# Patient Record
Sex: Female | Born: 1995 | Race: White | Hispanic: No | Marital: Married | Smoking: Former smoker
Health system: Southern US, Community
[De-identification: ages and names within clinical notes are randomized; demographics above are authoritative.]

## PROBLEM LIST (undated history)

## (undated) ENCOUNTER — Emergency Department (HOSPITAL_COMMUNITY): Admission: EM | Payer: Self-pay | Source: Home / Self Care

## (undated) ENCOUNTER — Inpatient Hospital Stay (HOSPITAL_COMMUNITY): Payer: Self-pay

## (undated) DIAGNOSIS — F329 Major depressive disorder, single episode, unspecified: Secondary | ICD-10-CM

## (undated) DIAGNOSIS — F419 Anxiety disorder, unspecified: Secondary | ICD-10-CM

## (undated) DIAGNOSIS — F32A Depression, unspecified: Secondary | ICD-10-CM

## (undated) DIAGNOSIS — E079 Disorder of thyroid, unspecified: Secondary | ICD-10-CM

## (undated) DIAGNOSIS — R569 Unspecified convulsions: Secondary | ICD-10-CM

## (undated) DIAGNOSIS — G40909 Epilepsy, unspecified, not intractable, without status epilepticus: Secondary | ICD-10-CM

## (undated) DIAGNOSIS — D649 Anemia, unspecified: Secondary | ICD-10-CM

## (undated) HISTORY — DX: Major depressive disorder, single episode, unspecified: F32.9

## (undated) HISTORY — PX: WISDOM TOOTH EXTRACTION: SHX21

## (undated) HISTORY — DX: Depression, unspecified: F32.A

---
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
## 2017-01-10 ENCOUNTER — Other Ambulatory Visit (HOSPITAL_COMMUNITY)
Admission: RE | Admit: 2017-01-10 | Discharge: 2017-01-10 | Disposition: A | Discharge status: Home / Self Care | Payer: Medicaid Other | Source: Ambulatory Visit | Attending: Obstetrics & Gynecology | Admitting: Obstetrics & Gynecology

## 2017-01-10 ENCOUNTER — Ambulatory Visit (INDEPENDENT_AMBULATORY_CARE_PROVIDER_SITE_OTHER): Payer: Medicaid Other | Admitting: Obstetrics & Gynecology

## 2017-01-10 VITALS — BP 121/74 | HR 130 | Temp 97.9°F | Wt 178.0 lb

## 2017-01-10 DIAGNOSIS — Z3A35 35 weeks gestation of pregnancy: Secondary | ICD-10-CM | POA: Diagnosis not present

## 2017-01-10 DIAGNOSIS — Z3483 Encounter for supervision of other normal pregnancy, third trimester: Secondary | ICD-10-CM | POA: Insufficient documentation

## 2017-01-10 DIAGNOSIS — Z3400 Encounter for supervision of normal first pregnancy, unspecified trimester: Secondary | ICD-10-CM

## 2017-01-10 DIAGNOSIS — Z3403 Encounter for supervision of normal first pregnancy, third trimester: Secondary | ICD-10-CM

## 2017-01-10 DIAGNOSIS — Z113 Encounter for screening for infections with a predominantly sexual mode of transmission: Secondary | ICD-10-CM

## 2017-01-10 NOTE — Progress Notes (Signed)
Pt c/o rectal pressure intermittently.
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
## 2017-01-12 LAB — URINE CYTOLOGY ANCILLARY ONLY
Chlamydia: NEGATIVE
NEISSERIA GONORRHEA: NEGATIVE

## 2017-01-12 NOTE — Addendum Note (Signed)
Addended by: Pennie Banter on: 01/12/2017 09:23 AM   Modules accepted: Orders

## 2017-01-24 ENCOUNTER — Ambulatory Visit (INDEPENDENT_AMBULATORY_CARE_PROVIDER_SITE_OTHER): Payer: Medicaid Other | Admitting: Obstetrics and Gynecology

## 2017-01-24 VITALS — BP 123/78 | HR 114 | Wt 179.0 lb

## 2017-01-24 DIAGNOSIS — Z3403 Encounter for supervision of normal first pregnancy, third trimester: Secondary | ICD-10-CM

## 2017-01-24 DIAGNOSIS — Z3400 Encounter for supervision of normal first pregnancy, unspecified trimester: Secondary | ICD-10-CM

## 2017-01-24 DIAGNOSIS — O09893 Supervision of other high risk pregnancies, third trimester: Secondary | ICD-10-CM

## 2017-01-24 DIAGNOSIS — Z6791 Unspecified blood type, Rh negative: Secondary | ICD-10-CM

## 2017-01-24 DIAGNOSIS — O26893 Other specified pregnancy related conditions, third trimester: Principal | ICD-10-CM

## 2017-01-24 LAB — OB RESULTS CONSOLE GBS: STREP GROUP B AG: NEGATIVE
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
## 2017-01-28 LAB — CULTURE, BETA STREP (GROUP B ONLY): Strep Gp B Culture: NEGATIVE

## 2017-01-31 ENCOUNTER — Other Ambulatory Visit: Payer: Self-pay | Admitting: Obstetrics & Gynecology

## 2017-01-31 ENCOUNTER — Ambulatory Visit (HOSPITAL_COMMUNITY)
Admission: RE | Admit: 2017-01-31 | Discharge: 2017-01-31 | Disposition: A | Discharge status: Home / Self Care | Payer: Medicaid Other | Source: Ambulatory Visit | Attending: Obstetrics & Gynecology | Admitting: Obstetrics & Gynecology

## 2017-01-31 ENCOUNTER — Ambulatory Visit (INDEPENDENT_AMBULATORY_CARE_PROVIDER_SITE_OTHER): Payer: Medicaid Other | Admitting: Obstetrics and Gynecology

## 2017-01-31 VITALS — BP 127/79 | HR 101 | Wt 184.0 lb

## 2017-01-31 DIAGNOSIS — Z3400 Encounter for supervision of normal first pregnancy, unspecified trimester: Secondary | ICD-10-CM

## 2017-01-31 DIAGNOSIS — Z3A38 38 weeks gestation of pregnancy: Secondary | ICD-10-CM

## 2017-01-31 DIAGNOSIS — O99323 Drug use complicating pregnancy, third trimester: Secondary | ICD-10-CM

## 2017-01-31 DIAGNOSIS — O360190 Maternal care for anti-D [Rh] antibodies, unspecified trimester, not applicable or unspecified: Secondary | ICD-10-CM | POA: Diagnosis not present

## 2017-01-31 DIAGNOSIS — O26843 Uterine size-date discrepancy, third trimester: Secondary | ICD-10-CM | POA: Insufficient documentation

## 2017-01-31 DIAGNOSIS — Z3403 Encounter for supervision of normal first pregnancy, third trimester: Secondary | ICD-10-CM

## 2017-01-31 DIAGNOSIS — F1210 Cannabis abuse, uncomplicated: Secondary | ICD-10-CM | POA: Insufficient documentation
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
## 2017-02-05 ENCOUNTER — Inpatient Hospital Stay (HOSPITAL_COMMUNITY): Payer: Medicaid Other | Admitting: Anesthesiology

## 2017-02-05 ENCOUNTER — Encounter (HOSPITAL_COMMUNITY)
Admission: AD | Disposition: A | Discharge status: Home / Self Care | Payer: Self-pay | Source: Ambulatory Visit | Attending: Obstetrics & Gynecology

## 2017-02-05 ENCOUNTER — Encounter (HOSPITAL_COMMUNITY): Payer: Self-pay

## 2017-02-05 ENCOUNTER — Inpatient Hospital Stay (HOSPITAL_COMMUNITY)
Admission: AD | Admit: 2017-02-05 | Discharge: 2017-02-08 | DRG: 765 | Disposition: A | Discharge status: Home / Self Care | Payer: Medicaid Other | Source: Ambulatory Visit | Attending: Obstetrics & Gynecology | Admitting: Obstetrics & Gynecology

## 2017-02-05 DIAGNOSIS — Z3A39 39 weeks gestation of pregnancy: Secondary | ICD-10-CM | POA: Diagnosis not present

## 2017-02-05 DIAGNOSIS — Z2839 Other underimmunization status: Secondary | ICD-10-CM

## 2017-02-05 DIAGNOSIS — O99344 Other mental disorders complicating childbirth: Secondary | ICD-10-CM | POA: Diagnosis present

## 2017-02-05 DIAGNOSIS — Z283 Underimmunization status: Secondary | ICD-10-CM

## 2017-02-05 DIAGNOSIS — F332 Major depressive disorder, recurrent severe without psychotic features: Secondary | ICD-10-CM | POA: Diagnosis present

## 2017-02-05 DIAGNOSIS — O9902 Anemia complicating childbirth: Secondary | ICD-10-CM | POA: Diagnosis present

## 2017-02-05 DIAGNOSIS — O9989 Other specified diseases and conditions complicating pregnancy, childbirth and the puerperium: Secondary | ICD-10-CM

## 2017-02-05 DIAGNOSIS — O26843 Uterine size-date discrepancy, third trimester: Secondary | ICD-10-CM | POA: Diagnosis present

## 2017-02-05 DIAGNOSIS — D649 Anemia, unspecified: Secondary | ICD-10-CM | POA: Diagnosis present

## 2017-02-05 DIAGNOSIS — O4292 Full-term premature rupture of membranes, unspecified as to length of time between rupture and onset of labor: Principal | ICD-10-CM | POA: Diagnosis present

## 2017-02-05 DIAGNOSIS — O26899 Other specified pregnancy related conditions, unspecified trimester: Secondary | ICD-10-CM

## 2017-02-05 DIAGNOSIS — O26893 Other specified pregnancy related conditions, third trimester: Secondary | ICD-10-CM | POA: Diagnosis present

## 2017-02-05 DIAGNOSIS — Z30017 Encounter for initial prescription of implantable subdermal contraceptive: Secondary | ICD-10-CM | POA: Diagnosis not present

## 2017-02-05 DIAGNOSIS — Z3400 Encounter for supervision of normal first pregnancy, unspecified trimester: Secondary | ICD-10-CM

## 2017-02-05 DIAGNOSIS — O34219 Maternal care for unspecified type scar from previous cesarean delivery: Secondary | ICD-10-CM

## 2017-02-05 DIAGNOSIS — O99324 Drug use complicating childbirth: Secondary | ICD-10-CM | POA: Diagnosis present

## 2017-02-05 DIAGNOSIS — F1210 Cannabis abuse, uncomplicated: Secondary | ICD-10-CM | POA: Diagnosis present

## 2017-02-05 DIAGNOSIS — Z98891 History of uterine scar from previous surgery: Secondary | ICD-10-CM

## 2017-02-05 DIAGNOSIS — Z3049 Encounter for surveillance of other contraceptives: Secondary | ICD-10-CM | POA: Diagnosis not present

## 2017-02-05 DIAGNOSIS — Z6791 Unspecified blood type, Rh negative: Secondary | ICD-10-CM

## 2017-02-05 DIAGNOSIS — IMO0002 Reserved for concepts with insufficient information to code with codable children: Secondary | ICD-10-CM

## 2017-02-05 DIAGNOSIS — O4290 Premature rupture of membranes, unspecified as to length of time between rupture and onset of labor, unspecified weeks of gestation: Secondary | ICD-10-CM | POA: Diagnosis present

## 2017-02-05 LAB — CBC
HCT: 28 % — ABNORMAL LOW (ref 36.0–46.0)
Hemoglobin: 8.8 g/dL — ABNORMAL LOW (ref 12.0–15.0)
MCH: 23.8 pg — AB (ref 26.0–34.0)
MCHC: 31.4 g/dL (ref 30.0–36.0)
MCV: 75.7 fL — AB (ref 78.0–100.0)
PLATELETS: 243 10*3/uL (ref 150–400)
RBC: 3.7 MIL/uL — ABNORMAL LOW (ref 3.87–5.11)
RDW: 15.3 % (ref 11.5–15.5)
WBC: 14.8 10*3/uL — ABNORMAL HIGH (ref 4.0–10.5)

## 2017-02-05 LAB — TYPE AND SCREEN
ABO/RH(D): O NEG
Antibody Screen: NEGATIVE

## 2017-02-05 LAB — RPR: RPR: NONREACTIVE

## 2017-02-05 LAB — POCT FERN TEST: POCT Fern Test: POSITIVE

## 2017-02-05 SURGERY — Surgical Case
Anesthesia: Spinal

## 2017-02-05 MED ORDER — EPHEDRINE 5 MG/ML INJ: 10.0000 mg | INTRAVENOUS | Status: DC | PRN

## 2017-02-05 MED ORDER — MORPHINE SULFATE (PF) 0.5 MG/ML IJ SOLN
INTRAMUSCULAR | Status: DC | PRN
  Administered 2017-02-05: .2 mg via INTRATHECAL

## 2017-02-05 MED ORDER — OXYTOCIN 40 UNITS IN LACTATED RINGERS INFUSION - SIMPLE MED
1.0000 m[IU]/min | INTRAVENOUS | Status: DC
  Administered 2017-02-05: 2 m[IU]/min via INTRAVENOUS
  Filled 2017-02-05: qty 1000

## 2017-02-05 MED ORDER — ONDANSETRON HCL 4 MG/2ML IJ SOLN
INTRAMUSCULAR | Status: AC
  Filled 2017-02-05: qty 2

## 2017-02-05 MED ORDER — PHENYLEPHRINE 40 MCG/ML (10ML) SYRINGE FOR IV PUSH (FOR BLOOD PRESSURE SUPPORT)
PREFILLED_SYRINGE | INTRAVENOUS | Status: AC
  Filled 2017-02-05: qty 20

## 2017-02-05 MED ORDER — ONDANSETRON HCL 4 MG/2ML IJ SOLN: 4.0000 mg | Freq: Four times a day (QID) | INTRAMUSCULAR | Status: DC | PRN

## 2017-02-05 MED ORDER — LACTATED RINGERS IV SOLN: 500.0000 mL | Freq: Once | INTRAVENOUS | Status: DC

## 2017-02-05 MED ORDER — ACETAMINOPHEN 325 MG PO TABS: 650.0000 mg | ORAL_TABLET | ORAL | Status: DC | PRN

## 2017-02-05 MED ORDER — FENTANYL 2.5 MCG/ML BUPIVACAINE 1/10 % EPIDURAL INFUSION (WH - ANES): 14.0000 mL/h | INTRAMUSCULAR | Status: DC | PRN

## 2017-02-05 MED ORDER — DEXTROSE 5 % IV SOLN
500.0000 mg | INTRAVENOUS | Status: AC
  Administered 2017-02-05: 500 mg via INTRAVENOUS
  Filled 2017-02-05: qty 500

## 2017-02-05 MED ORDER — BUPIVACAINE IN DEXTROSE 0.75-8.25 % IT SOLN
INTRATHECAL | Status: AC
  Filled 2017-02-05: qty 2

## 2017-02-05 MED ORDER — DEXAMETHASONE SODIUM PHOSPHATE 4 MG/ML IJ SOLN
INTRAMUSCULAR | Status: DC | PRN
  Administered 2017-02-05: 4 mg via INTRAVENOUS

## 2017-02-05 MED ORDER — BUPIVACAINE HCL (PF) 0.5 % IJ SOLN
INTRAMUSCULAR | Status: DC | PRN
  Administered 2017-02-05: 30 mL

## 2017-02-05 MED ORDER — TERBUTALINE SULFATE 1 MG/ML IJ SOLN: 0.2500 mg | Freq: Once | INTRAMUSCULAR | Status: DC | PRN

## 2017-02-05 MED ORDER — KETOROLAC TROMETHAMINE 30 MG/ML IJ SOLN
INTRAMUSCULAR | Status: AC
  Filled 2017-02-05: qty 1

## 2017-02-05 MED ORDER — CEFAZOLIN SODIUM-DEXTROSE 2-4 GM/100ML-% IV SOLN
INTRAVENOUS | Status: AC
  Filled 2017-02-05: qty 100

## 2017-02-05 MED ORDER — DIPHENHYDRAMINE HCL 50 MG/ML IJ SOLN: 12.5000 mg | INTRAMUSCULAR | Status: DC | PRN

## 2017-02-05 MED ORDER — FENTANYL 2.5 MCG/ML BUPIVACAINE 1/10 % EPIDURAL INFUSION (WH - ANES)
INTRAMUSCULAR | Status: AC
  Filled 2017-02-05: qty 100

## 2017-02-05 MED ORDER — LACTATED RINGERS IV SOLN: 500.0000 mL | INTRAVENOUS | Status: DC | PRN

## 2017-02-05 MED ORDER — FENTANYL CITRATE (PF) 100 MCG/2ML IJ SOLN
INTRAMUSCULAR | Status: DC | PRN
  Administered 2017-02-05: 10 ug via INTRATHECAL

## 2017-02-05 MED ORDER — STERILE WATER FOR IRRIGATION IR SOLN
Status: DC | PRN
  Administered 2017-02-05: 1000 mL

## 2017-02-05 MED ORDER — DEXAMETHASONE SODIUM PHOSPHATE 4 MG/ML IJ SOLN
INTRAMUSCULAR | Status: AC
  Filled 2017-02-05: qty 1

## 2017-02-05 MED ORDER — LACTATED RINGERS IV SOLN
INTRAVENOUS | Status: DC | PRN
  Administered 2017-02-05 (×3): via INTRAVENOUS

## 2017-02-05 MED ORDER — SOD CITRATE-CITRIC ACID 500-334 MG/5ML PO SOLN
30.0000 mL | ORAL | Status: DC | PRN
  Administered 2017-02-05: 30 mL via ORAL
  Filled 2017-02-05: qty 15

## 2017-02-05 MED ORDER — MORPHINE SULFATE (PF) 0.5 MG/ML IJ SOLN
INTRAMUSCULAR | Status: AC
  Filled 2017-02-05: qty 10

## 2017-02-05 MED ORDER — PHENYLEPHRINE 8 MG IN D5W 100 ML (0.08MG/ML) PREMIX OPTIME
INJECTION | INTRAVENOUS | Status: AC
  Filled 2017-02-05: qty 100

## 2017-02-05 MED ORDER — OXYTOCIN 10 UNIT/ML IJ SOLN
INTRAVENOUS | Status: DC | PRN
  Administered 2017-02-05 (×2): 40 [IU] via INTRAVENOUS

## 2017-02-05 MED ORDER — OXYTOCIN BOLUS FROM INFUSION: 500.0000 mL | Freq: Once | INTRAVENOUS | Status: DC

## 2017-02-05 MED ORDER — OXYCODONE-ACETAMINOPHEN 5-325 MG PO TABS: 1.0000 | ORAL_TABLET | ORAL | Status: DC | PRN

## 2017-02-05 MED ORDER — BUPIVACAINE HCL (PF) 0.25 % IJ SOLN: INTRAMUSCULAR | Status: DC | PRN

## 2017-02-05 MED ORDER — SCOPOLAMINE 1 MG/3DAYS TD PT72
MEDICATED_PATCH | TRANSDERMAL | Status: DC | PRN
  Administered 2017-02-05: 1 via TRANSDERMAL

## 2017-02-05 MED ORDER — PHENYLEPHRINE 40 MCG/ML (10ML) SYRINGE FOR IV PUSH (FOR BLOOD PRESSURE SUPPORT): 80.0000 ug | PREFILLED_SYRINGE | INTRAVENOUS | Status: DC | PRN

## 2017-02-05 MED ORDER — MEPERIDINE HCL 25 MG/ML IJ SOLN
INTRAMUSCULAR | Status: DC | PRN
  Administered 2017-02-05 (×2): 12.5 mg via INTRAVENOUS

## 2017-02-05 MED ORDER — CEFAZOLIN SODIUM-DEXTROSE 2-3 GM-% IV SOLR
INTRAVENOUS | Status: DC | PRN
  Administered 2017-02-05: 2 g via INTRAVENOUS

## 2017-02-05 MED ORDER — OXYTOCIN 40 UNITS IN LACTATED RINGERS INFUSION - SIMPLE MED: 2.5000 [IU]/h | INTRAVENOUS | Status: DC

## 2017-02-05 MED ORDER — DIPHENHYDRAMINE HCL 50 MG/ML IJ SOLN
INTRAMUSCULAR | Status: DC | PRN
  Administered 2017-02-05: 25 mg via INTRAVENOUS

## 2017-02-05 MED ORDER — LACTATED RINGERS IV SOLN
INTRAVENOUS | Status: DC
  Administered 2017-02-05 (×2): via INTRAVENOUS

## 2017-02-05 MED ORDER — PHENYLEPHRINE HCL 10 MG/ML IJ SOLN
INTRAMUSCULAR | Status: DC | PRN
  Administered 2017-02-05 (×3): 80 ug via INTRAVENOUS

## 2017-02-05 MED ORDER — SCOPOLAMINE 1 MG/3DAYS TD PT72
MEDICATED_PATCH | TRANSDERMAL | Status: AC
  Filled 2017-02-05: qty 1

## 2017-02-05 MED ORDER — BUPIVACAINE IN DEXTROSE 0.75-8.25 % IT SOLN
INTRATHECAL | Status: DC | PRN
  Administered 2017-02-05: 1.8 mL via INTRATHECAL

## 2017-02-05 MED ORDER — KETOROLAC TROMETHAMINE 30 MG/ML IJ SOLN: 30.0000 mg | Freq: Four times a day (QID) | INTRAMUSCULAR | Status: AC | PRN

## 2017-02-05 MED ORDER — PHENYLEPHRINE 8 MG IN D5W 100 ML (0.08MG/ML) PREMIX OPTIME
INJECTION | INTRAVENOUS | Status: DC | PRN
  Administered 2017-02-05: 60 ug/min via INTRAVENOUS

## 2017-02-05 MED ORDER — LIDOCAINE HCL (PF) 1 % IJ SOLN: 30.0000 mL | INTRAMUSCULAR | Status: DC | PRN

## 2017-02-05 MED ORDER — ONDANSETRON HCL 4 MG/2ML IJ SOLN
INTRAMUSCULAR | Status: DC | PRN
  Administered 2017-02-05: 4 mg via INTRAVENOUS

## 2017-02-05 MED ORDER — BUPIVACAINE HCL (PF) 0.5 % IJ SOLN
INTRAMUSCULAR | Status: AC
  Filled 2017-02-05: qty 30

## 2017-02-05 MED ORDER — KETOROLAC TROMETHAMINE 30 MG/ML IJ SOLN
30.0000 mg | Freq: Four times a day (QID) | INTRAMUSCULAR | Status: AC | PRN
  Administered 2017-02-05: 30 mg via INTRAMUSCULAR

## 2017-02-05 MED ORDER — MEPERIDINE HCL 25 MG/ML IJ SOLN
INTRAMUSCULAR | Status: AC
  Filled 2017-02-05: qty 1

## 2017-02-05 MED ORDER — CEFOTETAN DISODIUM-DEXTROSE 2-2.08 GM-% IV SOLR
2.0000 g | INTRAVENOUS | Status: DC
  Filled 2017-02-05: qty 50

## 2017-02-05 MED ORDER — FENTANYL CITRATE (PF) 100 MCG/2ML IJ SOLN
INTRAMUSCULAR | Status: AC
  Filled 2017-02-05: qty 2

## 2017-02-05 MED ORDER — DIPHENHYDRAMINE HCL 50 MG/ML IJ SOLN
INTRAMUSCULAR | Status: AC
  Filled 2017-02-05: qty 1

## 2017-02-05 MED ORDER — OXYTOCIN 10 UNIT/ML IJ SOLN
INTRAMUSCULAR | Status: AC
  Filled 2017-02-05: qty 4

## 2017-02-05 MED ORDER — SODIUM CHLORIDE 0.9 % IR SOLN
Status: DC | PRN
  Administered 2017-02-05: 100 mL

## 2017-02-05 MED ORDER — OXYCODONE-ACETAMINOPHEN 5-325 MG PO TABS: 2.0000 | ORAL_TABLET | ORAL | Status: DC | PRN

## 2017-02-05 SURGICAL SUPPLY — 40 items
BENZOIN TINCTURE PRP APPL 2/3 (GAUZE/BANDAGES/DRESSINGS) ×3 IMPLANT
BLADE TIP J-PLASMA PRECISE LAP (MISCELLANEOUS) ×3 IMPLANT
CHLORAPREP W/TINT 26ML (MISCELLANEOUS) ×3 IMPLANT
CLAMP CORD UMBIL (MISCELLANEOUS) IMPLANT
CLOSURE STERI-STRIP 1/2X4 (GAUZE/BANDAGES/DRESSINGS) ×1
CLOSURE WOUND 1/2 X4 (GAUZE/BANDAGES/DRESSINGS) ×1
CLOTH BEACON ORANGE TIMEOUT ST (SAFETY) ×3 IMPLANT
CLSR STERI-STRIP ANTIMIC 1/2X4 (GAUZE/BANDAGES/DRESSINGS) ×2 IMPLANT
DRSG OPSITE POSTOP 4X10 (GAUZE/BANDAGES/DRESSINGS) ×9 IMPLANT
ELECT REM PT RETURN 9FT ADLT (ELECTROSURGICAL) ×3
ELECTRODE REM PT RTRN 9FT ADLT (ELECTROSURGICAL) ×1 IMPLANT
EXTRACTOR VACUUM KIWI (MISCELLANEOUS) IMPLANT
GAUZE PACKING 1/2 X5 YD (GAUZE/BANDAGES/DRESSINGS) ×3 IMPLANT
GLOVE BIO SURGEON STRL SZ7 (GLOVE) ×3 IMPLANT
GLOVE BIOGEL PI IND STRL 7.0 (GLOVE) ×2 IMPLANT
GLOVE BIOGEL PI INDICATOR 7.0 (GLOVE) ×4
GOWN STRL REUS W/TWL LRG LVL3 (GOWN DISPOSABLE) ×6 IMPLANT
GOWN STRL REUS W/TWL XL LVL3 (GOWN DISPOSABLE) ×3 IMPLANT
KIT ABG SYR 3ML LUER SLIP (SYRINGE) IMPLANT
NEEDLE HYPO 22GX1.5 SAFETY (NEEDLE) ×3 IMPLANT
NEEDLE HYPO 25X5/8 SAFETYGLIDE (NEEDLE) IMPLANT
NS IRRIG 1000ML POUR BTL (IV SOLUTION) ×3 IMPLANT
PACK C SECTION WH (CUSTOM PROCEDURE TRAY) ×3 IMPLANT
PAD ABD DERMACEA PRESS 5X9 (GAUZE/BANDAGES/DRESSINGS) ×3 IMPLANT
PAD OB MATERNITY 4.3X12.25 (PERSONAL CARE ITEMS) ×3 IMPLANT
PENCIL SMOKE EVAC W/HOLSTER (ELECTROSURGICAL) ×3 IMPLANT
RTRCTR C-SECT PINK 25CM LRG (MISCELLANEOUS) IMPLANT
SPONGE SURGIFOAM ABS GEL 12-7 (HEMOSTASIS) IMPLANT
STRIP CLOSURE SKIN 1/2X4 (GAUZE/BANDAGES/DRESSINGS) ×2 IMPLANT
SUT PDS AB 0 CTX 60 (SUTURE) IMPLANT
SUT PLAIN 0 NONE (SUTURE) IMPLANT
SUT SILK 0 TIES 10X30 (SUTURE) IMPLANT
SUT VIC AB 0 CT1 36 (SUTURE) ×9 IMPLANT
SUT VIC AB 3-0 CT1 27 (SUTURE) ×2
SUT VIC AB 3-0 CT1 TAPERPNT 27 (SUTURE) ×1 IMPLANT
SUT VIC AB 4-0 KS 27 (SUTURE) ×3 IMPLANT
SYR CONTROL 10ML LL (SYRINGE) ×3 IMPLANT
TAPE CLOTH SURG 6X10 WHT LF (GAUZE/BANDAGES/DRESSINGS) ×3 IMPLANT
TOWEL OR 17X24 6PK STRL BLUE (TOWEL DISPOSABLE) ×3 IMPLANT
TRAY FOLEY BAG SILVER LF 14FR (SET/KITS/TRAYS/PACK) ×3 IMPLANT

## 2017-02-05 NOTE — Op Note (Signed)
Krista Combs PROCEDURE DATE: 02/05/2017  PREOPERATIVE DIAGNOSES: Intrauterine pregnancy at [redacted]w[redacted]d weeks gestation; non-reassuring fetal status  POSTOPERATIVE DIAGNOSES: The same  PROCEDURE: Primary Low Transverse Cesarean Section  SURGEON:  Dr. Eber Combs L. Harraway-Smith   ASSISTANT:  Dr. Caryl Combs  ANESTHESIOLOGY TEAM: Anesthesiologist: Krista Rich, MD CRNA: Krista Heritage, CRNA  INDICATIONS: Krista Combs is a 21 y.o. G3P0020 at [redacted]w[redacted]d here for cesarean section secondary to the indications listed under preoperative diagnoses; please see preoperative note for further details.  The risks of cesarean section were discussed with the patient including but were not limited to: bleeding which may require transfusion or reoperation; infection which may require antibiotics; injury to bowel, bladder, ureters or other surrounding organs; injury to the fetus; need for additional procedures including hysterectomy in the event of a life-threatening hemorrhage; placental abnormalities wth subsequent pregnancies, incisional problems, thromboembolic phenomenon and other postoperative/anesthesia complications.   The patient concurred with the proposed plan, giving informed written consent for the procedure.    FINDINGS:  Viable female infant in cephalic transverse presentation. Clear amniotic fluid.  Intact placenta, three vessel cord.  Normal uterus, fallopian tubes and ovaries bilaterally.  ANESTHESIA: Spinal INTRAVENOUS FLUIDS: 2000 ml   ESTIMATED BLOOD LOSS: 685 ml URINE OUTPUT:  650 ml SPECIMENS: Placenta sent to L&D COMPLICATIONS: None immediate  PROCEDURE IN DETAIL:  The patient preoperatively received intravenous antibiotics and had sequential compression devices applied to her lower extremities.  She was then taken to the operating room where spinal anesthesia was administered and was found to be adequate. She was then placed in a dorsal supine position with a leftward  tilt, and prepped and draped in a sterile manner.  A foley catheter was placed into her bladder and attached to constant gravity.  After an adequate timeout was performed, a Pfannenstiel skin incision was made with scalpel and carried through to the underlying layer of fascia. The fascia was incised in the midline, and this incision was extended bilaterally using the Mayo scissors.  Kocher clamps were applied to the superior aspect of the fascial incision and the underlying rectus muscles were dissected off bluntly. A similar process was carried out on the inferior aspect of the fascial incision. The rectus muscles were separated in the midline bluntly and the peritoneum was entered bluntly.  Attention was turned to the lower uterine segment where a low transverse hysterotomy incision was made with a scalpel and extended bilaterally bluntly.  The infant was successfully delivered, the cord was clamped and cut and the infant was handed over to awaiting neonatology team. The placenta was delivered manually. Uterine massage was then administered.  The placenta was intact with a three-vessel cord. The uterus was then cleared of clot and debris.  The hysterotomy was closed with 0 Vicryl in a running locked fashion, and an imbricating layer was also placed with the same suture. The uterus was returned to the pelvis. The pelvis was cleared of all clot and debris. Hemostasis was confirmed on all surfaces.  The peritoneum and the muscles were reapproximated using 0 Vicryl with 1 interrupted suture. The fascia was then closed using 0 Vicryl.  The subcutaneous layer was irrigated and 30 cc of 0.5% marcaine was injected into the incision. The skin was closed with a 4-0 Vicryl subcuticular stitch and benzoin and steristrips were applied.  The patient tolerated the procedure well. Sponge, lap, instrument and needle counts were correct x 2.  She was taken to the recovery room in stable condition.  Krista Ada, DO OB  Fellow 02/05/2017, 10:21 PM  Attestation of Attending Supervision of Fellow: Evaluation and management procedures were performed by the Fellow under my supervision and collaboration.  I have reviewed the Fellow's note and chart, and I agree with the management and plan.  I was scrubbed for the entire procedure.   Krista Combs L. Harraway-Smith, M.D., [place]

## 2017-02-05 NOTE — Anesthesia Pain Management Evaluation Note (Signed)
  CRNA Pain Management Visit Note  Patient: Krista Combs, 21 y.o., female  "Hello I am a member of the anesthesia team at [facility]. We have an anesthesia team available at all times to provide care throughout the hospital, including epidural management and anesthesia for C-section. I don't know your plan for the delivery whether it a natural birth, water birth, IV sedation, nitrous supplementation, doula or epidural, but we want to meet your pain goals."   1.Was your pain managed to your expectations on prior hospitalizations?   No prior hospitalizations  2.What is your expectation for pain management during this hospitalization?     IV pain meds  3.How can we help you reach that goal? Standby  Record the patient's initial score and the patient's pain goal.   Pain: 3  Pain Goal: 6 [facility] wants you to be able to say your pain was always managed very well.  [name] 02/05/2017

## 2017-02-05 NOTE — Anesthesia Preprocedure Evaluation (Addendum)
Anesthesia Evaluation  Patient identified by MRN, date of birth, ID band Patient awake    Reviewed: Allergy & Precautions, H&P , NPO status , Patient's Chart, lab work & pertinent test results  Airway Mallampati: II   Neck ROM: full    Dental   Pulmonary neg pulmonary ROS,    breath sounds clear to auscultation       Cardiovascular negative cardio ROS   Rhythm:regular Rate:Normal     Neuro/Psych Seizures -, Well Controlled,  PSYCHIATRIC DISORDERS Anxiety Depression    GI/Hepatic   Endo/Other    Renal/GU      Musculoskeletal   Abdominal   Peds  Hematology  (+) anemia ,   Anesthesia Other Findings   Reproductive/Obstetrics (+) Pregnancy                             Anesthesia Physical Anesthesia Plan  ASA: II  Anesthesia Plan: Spinal   Post-op Pain Management:    Induction: Intravenous  PONV Risk Score and Plan: 2 and Treatment may vary due to age or medical condition  Airway Management Planned: Simple Face Mask  Additional Equipment:   Intra-op Plan:   Post-operative Plan:   Informed Consent: I have reviewed the patients History and Physical, chart, labs and discussed the procedure including the risks, benefits and alternatives for the proposed anesthesia with the patient or authorized representative who has indicated his/her understanding and acceptance.     Plan Discussed with: Anesthesiologist, Surgeon and CRNA  Anesthesia Plan Comments:        Anesthesia Quick Evaluation

## 2017-02-05 NOTE — MAU Provider Note (Signed)
Pt informed that the ultrasound is considered a limited OB ultrasound and is not intended to be a complete ultrasound exam.  Patient also informed that the ultrasound is not being completed with the intent of assessing for fetal or placental anomalies or any pelvic abnormalities.  Explained that the purpose of today's ultrasound is to assess for  presentation.  Patient acknowledges the purpose of the exam and the limitations of the study.  Vertex presentation.   Katrinka Blazing, [place], CNM 02/05/2017 7:50 AM
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
## 2017-02-05 NOTE — Progress Notes (Signed)
S: Denies any complaints O: VSS, FHR pattern reassuring, sterile spec exam done and cook cath introduced thru cervix wihtout difficulty and inflated with 60c sterile fluid. Pt and fetus tolerated well. A: IUP @ 39.2 wks with SROM and no labor. Stable maternal fetal unit. P: pit aug of labor

## 2017-02-05 NOTE — Progress Notes (Signed)
DLawson, cnm reviewed fhr tracing and uc pattern. May leave pitocin at present level.

## 2017-02-05 NOTE — Anesthesia Procedure Notes (Signed)
Spinal  Patient location during procedure: OR Start time: 02/05/2017 9:09 PM End time: 02/05/2017 9:12 PM Staffing Anesthesiologist: Chaney Malling, Collen Vincent Performed: anesthesiologist  Preanesthetic Checklist Completed: patient identified, surgical consent, pre-op evaluation, timeout performed, IV checked, risks and benefits discussed and monitors and equipment checked Spinal Block Patient position: sitting Prep: DuraPrep Patient monitoring: cardiac monitor, continuous pulse ox and blood pressure Approach: midline Location: L3-4 Injection technique: single-shot Needle Needle type: Pencan  Needle gauge: 24 G Needle length: 9 cm Assessment Sensory level: T10 Additional Notes Functioning IV was confirmed and monitors were applied. Sterile prep and drape, including hand hygiene and sterile gloves were used. The patient was positioned and the spine was prepped. The skin was anesthetized with lidocaine.  Free flow of clear CSF was obtained prior to injecting local anesthetic into the CSF.  The spinal needle aspirated freely following injection.  The needle was carefully withdrawn.  The patient tolerated the procedure well.
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
## 2017-02-05 NOTE — Progress Notes (Signed)
Dr Freada Bergeron notified of pt's admission and status. Aware of SROM with cl fld, uterine activity. Will admit to [facility]

## 2017-02-05 NOTE — Progress Notes (Signed)
Feeling more uncomfortable. SVE: FB in vagina, 5.5/70/-1. FHR 150, mod, +accels, no decels, q2 min - cat 1. Continue pitocin. Trying for epidural free delivery; considering nitrous as next step. Burna Cash, MD Family Medicine Resident, PGY-3 02/05/2017 7:40 PM

## 2017-02-05 NOTE — Transfer of Care (Signed)
Immediate Anesthesia Transfer of Care Note  Patient: Krista Combs  Procedure(s) Performed: Procedure(s): CESAREAN SECTION (N/A)  Patient Location: PACU  Anesthesia Type:Spinal  Level of Consciousness: awake, alert  and oriented  Airway & Oxygen Therapy: Patient Spontanous Breathing  Post-op Assessment: Report given to RN and Post -op Vital signs reviewed and stable  Post vital signs: Reviewed and stable  Last Vitals:  Vitals:   02/05/17 1930 02/05/17 2000  BP: 130/66 119/78  Pulse: 83 88  Resp: 18 14  Temp: 36.8 C   SpO2:      Last Pain:  Vitals:   02/05/17 2030  TempSrc:   PainSc: 7       Patients Stated Pain Goal: 6 (02/05/17 1930)  Complications: No apparent anesthesia complications

## 2017-02-05 NOTE — MAU Note (Signed)
Leaking fld since 0300. Mild cramping. Clear fld

## 2017-02-05 NOTE — Progress Notes (Signed)
Patient ID: Devonne Doughty, female   DOB: Oct 14, 1995, 21 y.o.   MRN: [id number] Pt with Cat II FHR tracing since AROM. She presented with a h/o SROM but was noted to have a forebag that was AROM. Once that occurred she began to have a Cat 2 FHR. An amnioinfusion was started. The FHR is flat with subtle late decelerations. She is remote from delivery. I presented her options and pt wishes to proceed to cesarean delivery.   The risks of cesarean section discussed with the patient included but were not limited to: bleeding which may require transfusion or reoperation; infection which may require antibiotics; injury to bowel, bladder, ureters or other surrounding organs; injury to the fetus; need for additional procedures including hysterectomy in the event of a life-threatening hemorrhage; placental abnormalities wth subsequent pregnancies, incisional problems, thromboembolic phenomenon and other postoperative/anesthesia complications. The patient concurred with the proposed plan, giving informed written consent for the procedure.   Patient is NPO and will remain NPO for procedure. Anesthesia and OR aware. Preoperative prophylactic antibiotics and SCDs ordered on call to the OR.  To OR when ready.  Miyu Fenderson L. Harraway-Smith, M.D., [facility]

## 2017-02-06 ENCOUNTER — Encounter (HOSPITAL_COMMUNITY): Payer: Self-pay | Admitting: Obstetrics & Gynecology

## 2017-02-06 LAB — CBC
HCT: 22.5 % — ABNORMAL LOW (ref 36.0–46.0)
HEMOGLOBIN: 7.3 g/dL — AB (ref 12.0–15.0)
MCH: 24.6 pg — AB (ref 26.0–34.0)
MCHC: 32.4 g/dL (ref 30.0–36.0)
MCV: 75.8 fL — ABNORMAL LOW (ref 78.0–100.0)
Platelets: 227 10*3/uL (ref 150–400)
RBC: 2.97 MIL/uL — ABNORMAL LOW (ref 3.87–5.11)
RDW: 15.4 % (ref 11.5–15.5)
WBC: 17.8 10*3/uL — ABNORMAL HIGH (ref 4.0–10.5)

## 2017-02-06 MED ORDER — OXYCODONE HCL 5 MG PO TABS: 5.0000 mg | ORAL_TABLET | ORAL | Status: DC | PRN

## 2017-02-06 MED ORDER — FENTANYL CITRATE (PF) 100 MCG/2ML IJ SOLN: 25.0000 ug | INTRAMUSCULAR | Status: DC | PRN

## 2017-02-06 MED ORDER — SIMETHICONE 80 MG PO CHEW
80.0000 mg | CHEWABLE_TABLET | ORAL | Status: DC
  Administered 2017-02-06 – 2017-02-07 (×2): 80 mg via ORAL
  Filled 2017-02-06 (×2): qty 1

## 2017-02-06 MED ORDER — ONDANSETRON HCL 4 MG/2ML IJ SOLN: 4.0000 mg | Freq: Four times a day (QID) | INTRAMUSCULAR | Status: DC | PRN

## 2017-02-06 MED ORDER — ZOLPIDEM TARTRATE 5 MG PO TABS: 5.0000 mg | ORAL_TABLET | Freq: Every evening | ORAL | Status: DC | PRN

## 2017-02-06 MED ORDER — OXYCODONE HCL 5 MG/5ML PO SOLN: 5.0000 mg | Freq: Once | ORAL | Status: DC | PRN

## 2017-02-06 MED ORDER — LACTATED RINGERS IV SOLN: INTRAVENOUS | Status: DC

## 2017-02-06 MED ORDER — NALOXONE HCL 2 MG/2ML IJ SOSY
1.0000 ug/kg/h | PREFILLED_SYRINGE | INTRAMUSCULAR | Status: DC | PRN
  Filled 2017-02-06: qty 2

## 2017-02-06 MED ORDER — OXYCODONE HCL 5 MG PO TABS: 10.0000 mg | ORAL_TABLET | ORAL | Status: DC | PRN

## 2017-02-06 MED ORDER — NALBUPHINE HCL 10 MG/ML IJ SOLN: 5.0000 mg | INTRAMUSCULAR | Status: DC | PRN

## 2017-02-06 MED ORDER — DIBUCAINE 1 % RE OINT: 1.0000 "application " | TOPICAL_OINTMENT | RECTAL | Status: DC | PRN

## 2017-02-06 MED ORDER — SOD CITRATE-CITRIC ACID 500-334 MG/5ML PO SOLN: 30.0000 mL | ORAL | Status: AC

## 2017-02-06 MED ORDER — DIPHENHYDRAMINE HCL 50 MG/ML IJ SOLN: 12.5000 mg | INTRAMUSCULAR | Status: DC | PRN

## 2017-02-06 MED ORDER — TETANUS-DIPHTH-ACELL PERTUSSIS 5-2.5-18.5 LF-MCG/0.5 IM SUSP: 0.5000 mL | Freq: Once | INTRAMUSCULAR | Status: DC

## 2017-02-06 MED ORDER — MEPERIDINE HCL 25 MG/ML IJ SOLN: 6.2500 mg | INTRAMUSCULAR | Status: DC | PRN

## 2017-02-06 MED ORDER — PRENATAL MULTIVITAMIN CH
1.0000 | ORAL_TABLET | Freq: Every day | ORAL | Status: DC
  Administered 2017-02-06 – 2017-02-08 (×3): 1 via ORAL
  Filled 2017-02-06 (×2): qty 1

## 2017-02-06 MED ORDER — SIMETHICONE 80 MG PO CHEW: 80.0000 mg | CHEWABLE_TABLET | ORAL | Status: DC | PRN

## 2017-02-06 MED ORDER — COCONUT OIL OIL: 1.0000 "application " | TOPICAL_OIL | Status: DC | PRN

## 2017-02-06 MED ORDER — NALBUPHINE HCL 10 MG/ML IJ SOLN: 5.0000 mg | Freq: Once | INTRAMUSCULAR | Status: DC | PRN

## 2017-02-06 MED ORDER — SCOPOLAMINE 1 MG/3DAYS TD PT72
1.0000 | MEDICATED_PATCH | Freq: Once | TRANSDERMAL | Status: DC
  Filled 2017-02-06: qty 1

## 2017-02-06 MED ORDER — SIMETHICONE 80 MG PO CHEW
80.0000 mg | CHEWABLE_TABLET | Freq: Three times a day (TID) | ORAL | Status: DC
  Administered 2017-02-06 – 2017-02-08 (×8): 80 mg via ORAL
  Filled 2017-02-06 (×7): qty 1

## 2017-02-06 MED ORDER — MEASLES, MUMPS & RUBELLA VAC ~~LOC~~ INJ
0.5000 mL | INJECTION | Freq: Once | SUBCUTANEOUS | Status: DC
Start: 2017-02-06 — End: 2017-02-06

## 2017-02-06 MED ORDER — IBUPROFEN 600 MG PO TABS
600.0000 mg | ORAL_TABLET | Freq: Four times a day (QID) | ORAL | Status: DC
  Administered 2017-02-06 – 2017-02-08 (×10): 600 mg via ORAL
  Filled 2017-02-06 (×9): qty 1

## 2017-02-06 MED ORDER — MEASLES, MUMPS & RUBELLA VAC ~~LOC~~ INJ
0.5000 mL | INJECTION | Freq: Once | SUBCUTANEOUS | Status: DC
  Filled 2017-02-06: qty 0.5

## 2017-02-06 MED ORDER — DIPHENHYDRAMINE HCL 25 MG PO CAPS: 25.0000 mg | ORAL_CAPSULE | ORAL | Status: DC | PRN

## 2017-02-06 MED ORDER — ONDANSETRON HCL 4 MG/2ML IJ SOLN: 4.0000 mg | Freq: Three times a day (TID) | INTRAMUSCULAR | Status: DC | PRN

## 2017-02-06 MED ORDER — NALOXONE HCL 0.4 MG/ML IJ SOLN: 0.4000 mg | INTRAMUSCULAR | Status: DC | PRN

## 2017-02-06 MED ORDER — OXYCODONE HCL 5 MG PO TABS: 5.0000 mg | ORAL_TABLET | Freq: Once | ORAL | Status: DC | PRN

## 2017-02-06 MED ORDER — ACETAMINOPHEN 325 MG PO TABS: 650.0000 mg | ORAL_TABLET | ORAL | Status: DC | PRN

## 2017-02-06 MED ORDER — SODIUM CHLORIDE 0.9% FLUSH: 3.0000 mL | INTRAVENOUS | Status: DC | PRN

## 2017-02-06 MED ORDER — RHO D IMMUNE GLOBULIN 1500 UNIT/2ML IJ SOSY
300.0000 ug | PREFILLED_SYRINGE | Freq: Once | INTRAMUSCULAR | Status: AC
  Administered 2017-02-06: 300 ug via INTRAVENOUS
  Filled 2017-02-06: qty 2

## 2017-02-06 MED ORDER — MENTHOL 3 MG MT LOZG: 1.0000 | LOZENGE | OROMUCOSAL | Status: DC | PRN

## 2017-02-06 MED ORDER — DEXTROSE-NACL 5-0.45 % IV SOLN: INTRAVENOUS | Status: DC

## 2017-02-06 MED ORDER — WITCH HAZEL-GLYCERIN EX PADS: 1.0000 "application " | MEDICATED_PAD | CUTANEOUS | Status: DC | PRN

## 2017-02-06 MED ORDER — SENNOSIDES-DOCUSATE SODIUM 8.6-50 MG PO TABS
2.0000 | ORAL_TABLET | ORAL | Status: DC
  Administered 2017-02-06 – 2017-02-07 (×2): 2 via ORAL
  Filled 2017-02-06 (×2): qty 2

## 2017-02-06 MED ORDER — LACTATED RINGERS IV SOLN
INTRAVENOUS | Status: DC
  Administered 2017-02-06: 999 mL via INTRAVENOUS

## 2017-02-06 MED ORDER — OXYTOCIN 40 UNITS IN LACTATED RINGERS INFUSION - SIMPLE MED: 2.5000 [IU]/h | INTRAVENOUS | Status: AC

## 2017-02-06 MED ORDER — DIPHENHYDRAMINE HCL 25 MG PO CAPS: 25.0000 mg | ORAL_CAPSULE | Freq: Four times a day (QID) | ORAL | Status: DC | PRN
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
## 2017-02-06 NOTE — Progress Notes (Addendum)
Notified Dr Frances Furbish (resident) of patient's low orthostatic bp's.  Patient walking steady and unaided to bathroom (stand by assist) and back (unaided), rr's and temp WNL, not dizzy or nauseated. Fundus firm, U/E to U/1, bleeding smal foley output WNLl.  No concerns at this time (Dr Lacretia Nicks).  Jtwells, rn

## 2017-02-06 NOTE — Progress Notes (Signed)
Post Operative Day 1  Subjective:  Krista Combs is a 21 y.o. G3P0020 30w3ds/p LTCS.  No acute events overnight.  Pt denies problems with ambulating, voiding or po intake.  She denies nausea or vomiting.  Pain is well controlled.  She has not had flatus. She has not had bowel movement.  Lochia Moderate.  She is unsure about what she will do for birth control.  Method of Feeding: breast  Objective: BP (!) 98/47 (BP Location: Right Arm)   Pulse 96   Temp 99.6 F (37.6 C)   Resp 18   Ht '5\' 2"'  (1.575 m)   Wt 84.4 kg (186 lb)   LMP 05/06/2016 (Approximate)   SpO2 97%   BMI 34.02 kg/m   Physical Exam:  General: alert, cooperative and no distress Lochia:normal flow Chest: no increased work of breathing Abdomen: +BS, soft, nontender, fundus firm at/below umbilicus Uterine Fundus: firm DVT Evaluation: No evidence of DVT seen on physical exam. Extremities: No edema   Recent Labs  02/05/17 0755  HGB 8.8*  HCT 28.0*    Assessment/Plan:  ASSESSMENT: Krista Combs is a 21y.o. G3P0020 327w3dod #1 s/p LTCS doing well.   Rhogam and MMR ordered Decrease in hemoglobin from 8.8 pre-op to 7.3 post-op, which is appropriate. Plan for discharge home either tomorrow or Wednesday.   LOS: 1 day   Amanda C. WiShan LevansMD PGY-1, [facility] 02/06/2017 5:43 AM  OB FELLOW POSTPARTUM PROGRESS NOTE ATTESTATION  I have seen and examined this patient and agree with above documentation in the resident's note.   JuGailen ShelterMD OB Fellow 12:46 PM

## 2017-02-06 NOTE — Progress Notes (Signed)
Called Dr Erin Fulling to verify information received in PACU report to remove pressure dressing after 1 hour.  Leave on for 24 hours.  Jtwells, rn

## 2017-02-06 NOTE — Lactation Note (Addendum)
This note was copied from a baby's chart. Lactation Consultation Note  Patient Name: Krista Combs [id number] Date: 02/06/2017 Reason for consult: Initial assessment;Primapara;Term baby is 59 hours old,  LC reviewed and updated doc flow sheets.  After changing wet diaper, LC assisted mom to achieve better positioning  And depth at the breast. Multiple swallows noted , and increased with breast  Compressions . Fed 13 mins , and released. Nipple slightly slanted.  LC reviewed hand expressing, and per mom familiar with it and comfortable.  As  LC entered the room baby was latched shallow. Mother informed of post-discharge support and given phone number to the lactation department, including services for phone call assistance; out-patient appointments; and breastfeeding support group. List of other breastfeeding resources in the community given in the handout. Encouraged mother to call for problems or concerns related to breastfeeding. Mother informed of post-discharge support and given phone number to the lactation department, including services for phone call assistance; out-patient appointments; and breastfeeding support group. List of other breastfeeding resources in the community given in the handout. Encouraged mother to call for problems or concerns related to breastfeeding.  LC instructed on the use of shells due to edema at the base of the nipple.  LC also noted the baby to have a midline indentation of the tongue. LC noted the baby to stick his tongue over his gum line.  Adequate distance, and mom was comfortable with feeding .  LC did not mention tongue findings with mom.     Maternal Data Has patient been taught Hand Expression?: Yes  Feeding Feeding Type: Breast Fed Length of feed:  (swallows noted )  LATCH Score Latch: Grasps breast easily, tongue down, lips flanged, rhythmical sucking.  Audible Swallowing: Spontaneous and intermittent  Type of Nipple: Everted at  rest and after stimulation  Comfort (Breast/Nipple): Soft / non-tender  Hold (Positioning): Assistance needed to correctly position infant at breast and maintain latch.  LATCH Score: 9  Interventions Interventions: Breast feeding basics reviewed;Assisted with latch;Skin to skin;Breast massage;Hand express;Breast compression;Adjust position;Support pillows;Position options;Expressed milk;Shells  Lactation Tools Discussed/Used Tools: Shells Shell Type: Inverted WIC Program: Yes   Consult Status Consult Status: Follow-up Date: 02/07/17 Follow-up type: In-patient    Krista Combs 02/06/2017, 1:40 PM

## 2017-02-06 NOTE — Anesthesia Postprocedure Evaluation (Signed)
Anesthesia Post Note  Patient: Krista Combs  Procedure(s) Performed: Procedure(s) (LRB): CESAREAN SECTION (N/A)     Patient location during evaluation: PACU Anesthesia Type: Spinal Level of consciousness: oriented and awake and alert Pain management: pain level controlled Vital Signs Assessment: post-procedure vital signs reviewed and stable Respiratory status: spontaneous breathing and respiratory function stable Cardiovascular status: blood pressure returned to baseline and stable Postop Assessment: no headache, no backache and no apparent nausea or vomiting Anesthetic complications: no    Last Vitals:  Vitals:   02/06/17 0420 02/06/17 0454  BP:    Pulse:  96  Resp: 18   Temp: 37.6 C   SpO2:  97%    Last Pain:  Vitals:   02/06/17 0521  TempSrc:   PainSc: 0-No pain   Pain Goal: Patients Stated Pain Goal: 6 (02/05/17 1930)               [name]

## 2017-02-07 ENCOUNTER — Telehealth: Payer: Self-pay | Admitting: Radiology

## 2017-02-07 ENCOUNTER — Encounter: Payer: Self-pay | Admitting: Family Medicine

## 2017-02-07 DIAGNOSIS — Z3049 Encounter for surveillance of other contraceptives: Secondary | ICD-10-CM

## 2017-02-07 LAB — RH IG WORKUP (INCLUDES ABO/RH)
ABO/RH(D): O NEG
Fetal Screen: NEGATIVE
Gestational Age(Wks): 39.2
Unit division: 0

## 2017-02-07 MED ORDER — FERROUS SULFATE 325 (65 FE) MG PO TABS: 325.0000 mg | ORAL_TABLET | Freq: Two times a day (BID) | ORAL | 3 refills | Status: DC

## 2017-02-07 MED ORDER — ETONOGESTREL 68 MG ~~LOC~~ IMPL: 68.0000 mg | DRUG_IMPLANT | Freq: Once | SUBCUTANEOUS | Status: DC

## 2017-02-07 MED ORDER — IBUPROFEN 600 MG PO TABS: 600.0000 mg | ORAL_TABLET | Freq: Four times a day (QID) | ORAL | 0 refills | Status: DC

## 2017-02-07 MED ORDER — ETONOGESTREL 68 MG ~~LOC~~ IMPL
68.0000 mg | DRUG_IMPLANT | Freq: Once | SUBCUTANEOUS | Status: AC
  Administered 2017-02-08: 68 mg via SUBCUTANEOUS
  Filled 2017-02-07: qty 1

## 2017-02-07 MED ORDER — FERROUS SULFATE 325 (65 FE) MG PO TABS
325.0000 mg | ORAL_TABLET | Freq: Two times a day (BID) | ORAL | Status: DC
  Administered 2017-02-07 – 2017-02-08 (×2): 325 mg via ORAL
  Filled 2017-02-07 (×2): qty 1

## 2017-02-07 MED ORDER — LIDOCAINE HCL 1 % IJ SOLN: 0.0000 mL | Freq: Once | INTRAMUSCULAR | Status: DC | PRN

## 2017-02-07 MED ORDER — OXYCODONE HCL 5 MG PO TABS: 5.0000 mg | ORAL_TABLET | ORAL | 0 refills | Status: DC | PRN

## 2017-02-07 MED ORDER — BISACODYL 10 MG RE SUPP: 10.0000 mg | Freq: Once | RECTAL | Status: DC

## 2017-02-07 MED ORDER — ACETAMINOPHEN 325 MG PO TABS: 650.0000 mg | ORAL_TABLET | ORAL | 0 refills | Status: DC | PRN

## 2017-02-07 MED ORDER — LIDOCAINE HCL 1 % IJ SOLN
0.0000 mL | Freq: Once | INTRAMUSCULAR | Status: DC | PRN
  Filled 2017-02-07: qty 20
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
## 2017-02-07 NOTE — Discharge Instructions (Signed)
We want you to go to your OB/GYN doctor in 2 weeks - please call and schedule your appointment.

## 2017-02-07 NOTE — Progress Notes (Signed)
RN found mom sitting on couch crying during rounds. Mom said she was just emotional and she wasn't sure why.  "It's just all hitting me." Mom said she was a little nervous and excited at the same time. She denied needing anything and said she was okay. She was holding infant swaddled in her arms. FOB asleep beside her. Will continue to monitor. Royston Cowper, RN

## 2017-02-07 NOTE — Lactation Note (Signed)
This note was copied from a baby's chart. Lactation Consultation Note  Patient Name: Krista Combs [id number] Date: 02/07/2017 Reason for consult: Follow-up assessment;Infant weight loss (7% weight loss ) Baby is 40 hours old and at 7% weight loss.  Baby recently breast fed and the [facility] is updating the chart.  LC recommended since the baby is at 7% weight loss to offer the 2nd breast every feeding.  Also prior to latch - breast massage, hand express, pre- pump to  Prime the milk ducts And then reverse pressure exercise due to some areola edema.  LC assessed breast tissue with moms permission and had mom compress areola and the edema  Actually had improved. Per mom hasn't used shells yet due to not being able to take a shower yet.  Mom aware to call for feeding assessment latch score.    Maternal Data    Feeding Feeding Type:  (baby recently breast fed ) Length of feed: 25 min  LATCH Score                   Interventions Interventions: Breast feeding basics reviewed  Lactation Tools Discussed/Used Tools: Shells;Pump (mom has a HP at bedside ) Shell Type: Inverted Breast pump type: Manual   Consult Status Consult Status: Follow-up Date: 02/08/17 Follow-up type: In-patient    Matilde Sprang Tracey Stewart 02/07/2017, 1:38 PM

## 2017-02-07 NOTE — Telephone Encounter (Signed)
Left message on cell phone voicemail to call [facility], to be seen for 2 Wk postpartum due to history of depression.

## 2017-02-07 NOTE — Progress Notes (Signed)
Spoke with resident regarding D/C order placed this AM. Mom is less than 48 hours out from surgery and baby not going home. Orders given to discontinue D/C order. Royston Cowper, RN

## 2017-02-07 NOTE — Procedures (Signed)
PROCEDURE NOTE: Nexplanon Insertion  Patient given informed consent, signed copy in the chart, time out was performed.  Postpartum day 2.  Patient's left arm was prepped and draped in the usual sterile fashion. The ruler used to measure and mark insertion area. Pt was prepped with alcohol swab and then injected with 3 cc of 1% lidocaine with epinephrine used to anesthetize the area.  Pt was prepped with betadine, nexplanon removed from packaging, device confirmed in needle, then inserted full length of needle and withdrawn per manufacturer's instructions. Minimal blood loss. The insertion site covered with pressure bandage to minimize bruising. There were no complications and the patient tolerated the procedure well.  Device information was given in handout form. Patient is informed the removal date will be in three years and package insert card filled out and given to her.   West Pugh, DO PGY-2 Family Medicine Resident 02/07/17 [phone]

## 2017-02-08 ENCOUNTER — Encounter (HOSPITAL_COMMUNITY): Payer: Self-pay
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
## 2017-02-08 NOTE — Lactation Note (Signed)
This note was copied from a baby's chart. Lactation Consultation Note  Patient Name: Krista Combs [id number] Date: 02/08/2017 Reason for consult: Follow-up assessment   Baby 59 hours old.  P1.  9.4% weight loss. Oral assessment indicated short mid anterior lingual frenulum with tongue indention. Suggest discussing with Pediatrician. Baby latched easily in football position.  Noted clicking at first until mother pulled baby in closer during feeding. Rhythmical sucks and swallows observed.  Recommend mother continues to post pump w/ DEBP for 10-20 min 4-5 times per day. Give volume back to baby with the difference with formula until weight stabilizes. Mother states she has a DEBP at home to use. Mom encouraged to feed baby 8-12 times/24 hours and with feeding cues.  Reviewed engorgement care and monitoring voids/stools. Suggest making OP if problems with soreness or weight continue.   Maternal Data    Feeding Feeding Type: Breast Fed  LATCH Score Latch: Grasps breast easily, tongue down, lips flanged, rhythmical sucking.  Audible Swallowing: Spontaneous and intermittent  Type of Nipple: Everted at rest and after stimulation  Comfort (Breast/Nipple): Filling, red/small blisters or bruises, mild/mod discomfort  Hold (Positioning): No assistance needed to correctly position infant at breast.  LATCH Score: 9  Interventions Interventions: Breast feeding basics reviewed;Skin to skin;Breast compression;DEBP  Lactation Tools Discussed/Used     Consult Status Consult Status: Complete    Hardie Pulley 02/08/2017, 9:29 AM

## 2017-03-15 ENCOUNTER — Ambulatory Visit (INDEPENDENT_AMBULATORY_CARE_PROVIDER_SITE_OTHER): Payer: Medicaid Other | Admitting: Family Medicine

## 2017-03-15 ENCOUNTER — Encounter: Payer: Self-pay | Admitting: Family Medicine

## 2017-03-15 VITALS — BP 114/71 | HR 103 | Ht 62.0 in | Wt 172.6 lb

## 2017-03-15 DIAGNOSIS — F332 Major depressive disorder, recurrent severe without psychotic features: Secondary | ICD-10-CM

## 2017-03-15 MED ORDER — FLUOXETINE HCL 20 MG PO CAPS: 20.0000 mg | ORAL_CAPSULE | Freq: Every day | ORAL | 3 refills | Status: DC

## 2017-03-15 NOTE — Patient Instructions (Signed)
Postpartum Depression and Baby Blues The postpartum period begins right after the birth of a baby. During this time, there is often a great amount of joy and excitement. It is also a time of many changes in the life of the parents. Regardless of how many times a mother gives birth, each child brings new challenges and dynamics to the family. It is not unusual to have feelings of excitement along with confusing shifts in moods, emotions, and thoughts. All mothers are at risk of developing postpartum depression or the "baby blues." These mood changes can occur right after giving birth, or they may occur many months after giving birth. The baby blues or postpartum depression can be mild or severe. Additionally, postpartum depression can go away rather quickly, or it can be a long-term condition. What are the causes? Raised hormone levels and the rapid drop in those levels are thought to be a main cause of postpartum depression and the baby blues. A number of hormones change during and after pregnancy. Estrogen and progesterone usually decrease right after the delivery of your baby. The levels of thyroid hormone and various cortisol steroids also rapidly drop. Other factors that play a role in these mood changes include major life events and genetics. What increases the risk? If you have any of the following risks for the baby blues or postpartum depression, know what symptoms to watch out for during the postpartum period. Risk factors that may increase the likelihood of getting the baby blues or postpartum depression include:  Having a personal or family history of depression.  Having depression while being pregnant.  Having premenstrual mood issues or mood issues related to oral contraceptives.  Having a lot of life stress.  Having marital conflict.  Lacking a social support network.  Having a baby with special needs.  Having health problems, such as diabetes.  What are the signs or  symptoms? Symptoms of baby blues include:  Brief changes in mood, such as going from extreme happiness to sadness.  Decreased concentration.  Difficulty sleeping.  Crying spells, tearfulness.  Irritability.  Anxiety.  Symptoms of postpartum depression typically begin within the first month after giving birth. These symptoms include:  Difficulty sleeping or excessive sleepiness.  Marked weight loss.  Agitation.  Feelings of worthlessness.  Lack of interest in activity or food.  Postpartum psychosis is a very serious condition and can be dangerous. Fortunately, it is rare. Displaying any of the following symptoms is cause for immediate medical attention. Symptoms of postpartum psychosis include:  Hallucinations and delusions.  Bizarre or disorganized behavior.  Confusion or disorientation.  How is this diagnosed? A diagnosis is made by an evaluation of your symptoms. There are no medical or lab tests that lead to a diagnosis, but there are various questionnaires that a health care provider may use to identify those with the baby blues, postpartum depression, or psychosis. Often, a screening tool called the Edinburgh Postnatal Depression Scale is used to diagnose depression in the postpartum period. How is this treated? The baby blues usually goes away on its own in 1-2 weeks. Social support is often all that is needed. You will be encouraged to get adequate sleep and rest. Occasionally, you may be given medicines to help you sleep. Postpartum depression requires treatment because it can last several months or longer if it is not treated. Treatment may include individual or group therapy, medicine, or both to address any social, physiological, and psychological factors that may play a role in the   depression. Regular exercise, a healthy diet, rest, and social support may also be strongly recommended. Postpartum psychosis is more serious and needs treatment right away.  Hospitalization is often needed. Follow these instructions at home:  Get as much rest as you can. Nap when the baby sleeps.  Exercise regularly. Some women find yoga and walking to be beneficial.  Eat a balanced and nourishing diet.  Do little things that you enjoy. Have a cup of tea, take a bubble bath, read your favorite magazine, or listen to your favorite music.  Avoid alcohol.  Ask for help with household chores, cooking, grocery shopping, or running errands as needed. Do not try to do everything.  Talk to people close to you about how you are feeling. Get support from your partner, family members, friends, or other new moms.  Try to stay positive in how you think. Think about the things you are grateful for.  Do not spend a lot of time alone.  Only take over-the-counter or prescription medicine as directed by your health care provider.  Keep all your postpartum appointments.  Let your health care provider know if you have any concerns. Contact a health care provider if: You are having a reaction to or problems with your medicine. Get help right away if:  You have suicidal feelings.  You think you may harm the baby or someone else. This information is not intended to replace advice given to you by your health care provider. Make sure you discuss any questions you have with your health care provider. Document Released: 02/11/2004 Document Revised: 10/15/2015 Document Reviewed: 02/18/2013 Elsevier Interactive Patient Education  2017 [employer].  

## 2017-03-15 NOTE — Progress Notes (Signed)
Post Partum Exam  Krista DammeHailey Chesney is a 21 y.o. [id number] female who presents for a postpartum visit. She is 5 weeks postpartum following a low cervical transverse Cesarean section. I have fully reviewed the prenatal and intrapartum course. The delivery was at 39 gestational weeks.  Anesthesia: spinal. Postpartum course has been good. Baby's course has been good. Baby is feeding by both breast and bottle - gerber gentle. Bleeding thin lochia. Bowel function is normal. Bladder function is normal. Patient is not sexually active. Contraception method is Nexplanon received in hospital pp Postpartum depression screening:pos score 19  The following portions of the patient's history were reviewed and updated as appropriate: allergies, current medications, past family history, past medical history, past social history, past surgical history and problem list.  Review of Systems A comprehensive review of systems was negative except for: Behavioral/Psych: positive for anxiety, depression, mood swings and negative for hallucinations, suicial or homicidal thoughts    Objective:  Blood pressure 114/71, pulse (!) 103, height 5\' 2"  (1.575 m), weight 172 lb 9.6 oz (78.3 kg), currently breastfeeding.  General:  alert, cooperative and appears stated age  Lungs: normal effort  Heart:  regular rate and rhythm  Abdomen: soft, non-tender; bowel sounds normal; no masses,  no organomegaly and incision is well healed        Assessment:    Postpartum exam. Pap smear not done at today's visit.   Plan:   1. Contraception: Nexplanon 2. Pap not due until age 21 3. Due to h/o depression-->will begin Prozac. Has tried and failed Zoloft and Lexapro in the past. [facility] and she declined. Wanted meds--risks with BF discussed. Contracts for safety 4. Follow up in: 4 weeks or as needed.

## 2017-04-12 ENCOUNTER — Ambulatory Visit: Payer: Self-pay | Admitting: Family Medicine

## 2017-04-12 ENCOUNTER — Encounter: Payer: Self-pay | Admitting: Family Medicine

## 2017-04-12 NOTE — Progress Notes (Signed)
Patient did not keep appointment today. She may call to reschedule.  

## 2017-04-12 NOTE — Progress Notes (Deleted)
Follow up postpartum depression

## 2017-06-15 ENCOUNTER — Other Ambulatory Visit: Payer: Self-pay

## 2017-06-15 ENCOUNTER — Emergency Department (HOSPITAL_COMMUNITY): Payer: Medicaid Other

## 2017-06-15 ENCOUNTER — Emergency Department (HOSPITAL_COMMUNITY)
Admission: EM | Admit: 2017-06-15 | Discharge: 2017-06-15 | Disposition: A | Discharge status: Home / Self Care | Payer: Medicaid Other | Attending: Emergency Medicine | Admitting: Emergency Medicine

## 2017-06-15 ENCOUNTER — Encounter (HOSPITAL_COMMUNITY): Payer: Self-pay

## 2017-06-15 DIAGNOSIS — J111 Influenza due to unidentified influenza virus with other respiratory manifestations: Secondary | ICD-10-CM | POA: Insufficient documentation

## 2017-06-15 DIAGNOSIS — Z91010 Allergy to peanuts: Secondary | ICD-10-CM | POA: Diagnosis not present

## 2017-06-15 DIAGNOSIS — F17210 Nicotine dependence, cigarettes, uncomplicated: Secondary | ICD-10-CM | POA: Insufficient documentation

## 2017-06-15 DIAGNOSIS — Z79899 Other long term (current) drug therapy: Secondary | ICD-10-CM | POA: Diagnosis not present

## 2017-06-15 DIAGNOSIS — R0602 Shortness of breath: Secondary | ICD-10-CM | POA: Diagnosis present

## 2017-06-15 DIAGNOSIS — R69 Illness, unspecified: Secondary | ICD-10-CM

## 2017-06-15 LAB — RAPID STREP SCREEN (MED CTR MEBANE ONLY): Streptococcus, Group A Screen (Direct): NEGATIVE

## 2017-06-15 MED ORDER — OSELTAMIVIR PHOSPHATE 75 MG PO CAPS
75.0000 mg | ORAL_CAPSULE | Freq: Once | ORAL | Status: AC
  Administered 2017-06-15: 75 mg via ORAL
  Filled 2017-06-15: qty 1

## 2017-06-15 MED ORDER — HYDROCODONE-ACETAMINOPHEN 5-325 MG PO TABS: ORAL_TABLET | ORAL | 0 refills | Status: DC

## 2017-06-15 MED ORDER — OSELTAMIVIR PHOSPHATE 75 MG PO CAPS: 75.0000 mg | ORAL_CAPSULE | Freq: Two times a day (BID) | ORAL | 0 refills | Status: DC

## 2017-06-15 NOTE — Discharge Instructions (Signed)

## 2017-06-15 NOTE — ED Triage Notes (Addendum)
Per Pt, Pt is coming from home with generalized body aches and sore throat that started yesterday. Pt complains that she has had some headaches along with cold chills along with the symptoms.   Pt reports getting SOB with long distances and having to catch her breath.
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
## 2017-06-17 LAB — CULTURE, GROUP A STREP (THRC)

## 2017-09-06 ENCOUNTER — Encounter: Payer: Medicaid Other | Admitting: Family Medicine

## 2017-09-06 NOTE — Progress Notes (Signed)
Discuss Nexplanon and side effects

## 2017-09-13 ENCOUNTER — Encounter: Payer: Self-pay | Admitting: Family Medicine

## 2017-09-13 ENCOUNTER — Ambulatory Visit (INDEPENDENT_AMBULATORY_CARE_PROVIDER_SITE_OTHER): Payer: Medicaid Other | Admitting: Family Medicine

## 2017-09-13 VITALS — BP 117/82 | HR 74 | Ht 62.0 in | Wt 169.0 lb

## 2017-09-13 DIAGNOSIS — Z3009 Encounter for other general counseling and advice on contraception: Secondary | ICD-10-CM | POA: Diagnosis not present

## 2017-09-13 DIAGNOSIS — Z3046 Encounter for surveillance of implantable subdermal contraceptive: Secondary | ICD-10-CM

## 2017-09-13 DIAGNOSIS — Z3202 Encounter for pregnancy test, result negative: Secondary | ICD-10-CM

## 2017-09-13 DIAGNOSIS — Z01812 Encounter for preprocedural laboratory examination: Secondary | ICD-10-CM

## 2017-09-13 LAB — POCT URINE PREGNANCY: Preg Test, Ur: NEGATIVE

## 2017-09-13 MED ORDER — ETONOGESTREL 68 MG ~~LOC~~ IMPL: 68.0000 mg | DRUG_IMPLANT | Freq: Once | SUBCUTANEOUS | Status: DC

## 2017-09-13 NOTE — Progress Notes (Signed)
Patient left without being seen.

## 2017-09-13 NOTE — Progress Notes (Signed)
   Subjective:    Patient ID: Krista DammeHailey Meulemans is a 22 y.o. female presenting with No chief complaint on file.  on 09/13/2017  HPI: Has a Nexplanon and is bleeding all the time. Has had it for 8 months. Bleeds for 3 wks straight, then back on again for weeks at a time. Desires removal. Would like Nuvaring eventually. Will try condoms for now.  Review of Systems  Constitutional: Negative for chills and fever.  Respiratory: Negative for shortness of breath.   Cardiovascular: Negative for chest pain.  Gastrointestinal: Negative for abdominal pain, nausea and vomiting.  Genitourinary: Positive for vaginal bleeding. Negative for dysuria.  Skin: Negative for rash.      Objective:    LMP 08/26/2017  Physical Exam  Constitutional: She is oriented to person, place, and time. She appears well-developed and well-nourished. No distress.  HENT:  Head: Normocephalic and atraumatic.  Eyes: No scleral icterus.  Neck: Neck supple.  Cardiovascular: Normal rate.  Pulmonary/Chest: Effort normal.  Abdominal: Soft.  Neurological: She is alert and oriented to person, place, and time.  Skin: Skin is warm and dry.  Psychiatric: She has a normal mood and affect.      Assessment & Plan:  Encounter for preprocedural laboratory examination - Plan: POCT urine pregnancy  Nexplanon removal - Removed due to bleeding  Encounter for other general counseling or advice on contraception - Use condoms, call us when ready for NuvaRing.--discussed other options.   Total face-to-face time with patient: 10 minutes. Over 50% of encounter was spent on counseling and coordination of care. Return if symptoms worsen or fail to improve.  Reva Boresanya S Pratt 09/13/2017 11:19 AM

## 2017-09-13 NOTE — Patient Instructions (Signed)

## 2017-11-10 ENCOUNTER — Encounter (HOSPITAL_COMMUNITY): Payer: Self-pay | Admitting: *Deleted

## 2017-11-10 ENCOUNTER — Other Ambulatory Visit: Payer: Self-pay

## 2017-11-10 ENCOUNTER — Emergency Department (HOSPITAL_COMMUNITY)
Admission: EM | Admit: 2017-11-10 | Discharge: 2017-11-10 | Disposition: A | Discharge status: Home / Self Care | Payer: Medicaid Other | Attending: Emergency Medicine | Admitting: Emergency Medicine

## 2017-11-10 DIAGNOSIS — F17210 Nicotine dependence, cigarettes, uncomplicated: Secondary | ICD-10-CM | POA: Insufficient documentation

## 2017-11-10 DIAGNOSIS — N939 Abnormal uterine and vaginal bleeding, unspecified: Secondary | ICD-10-CM | POA: Diagnosis not present

## 2017-11-10 DIAGNOSIS — Z91010 Allergy to peanuts: Secondary | ICD-10-CM | POA: Insufficient documentation

## 2017-11-10 DIAGNOSIS — R109 Unspecified abdominal pain: Secondary | ICD-10-CM

## 2017-11-10 DIAGNOSIS — R102 Pelvic and perineal pain: Secondary | ICD-10-CM | POA: Diagnosis present

## 2017-11-10 LAB — WET PREP, GENITAL
Clue Cells Wet Prep HPF POC: NONE SEEN
SPERM: NONE SEEN
Trich, Wet Prep: NONE SEEN
Yeast Wet Prep HPF POC: NONE SEEN

## 2017-11-10 LAB — BASIC METABOLIC PANEL
Anion gap: 9 (ref 5–15)
BUN: 7 mg/dL (ref 6–20)
CALCIUM: 8.9 mg/dL (ref 8.9–10.3)
CHLORIDE: 108 mmol/L (ref 101–111)
CO2: 23 mmol/L (ref 22–32)
CREATININE: 0.6 mg/dL (ref 0.44–1.00)
GFR calc Af Amer: >60 mL/min (ref >60)
GFR calc non Af Amer: >60 mL/min (ref >60)
Glucose, Bld: 87 mg/dL (ref 65–99)
Potassium: 3.6 mmol/L (ref 3.5–5.1)
SODIUM: 140 mmol/L (ref 135–145)

## 2017-11-10 LAB — CBC WITH DIFFERENTIAL/PLATELET
Abs Immature Granulocytes: 0 10*3/uL (ref 0.0–0.1)
BASOS ABS: 0.1 10*3/uL (ref 0.0–0.1)
Basophils Relative: 1 %
EOS ABS: 0.2 10*3/uL (ref 0.0–0.7)
EOS PCT: 2 %
HEMATOCRIT: 37.3 % (ref 36.0–46.0)
Hemoglobin: 11.4 g/dL — ABNORMAL LOW (ref 12.0–15.0)
Immature Granulocytes: 0 %
Lymphocytes Relative: 30 %
Lymphs Abs: 2.7 10*3/uL (ref 0.7–4.0)
MCH: 24.6 pg — AB (ref 26.0–34.0)
MCHC: 30.6 g/dL (ref 30.0–36.0)
MCV: 80.6 fL (ref 78.0–100.0)
MONO ABS: 1 10*3/uL (ref 0.1–1.0)
Monocytes Relative: 11 %
Neutro Abs: 5.1 10*3/uL (ref 1.7–7.7)
Neutrophils Relative %: 56 %
Platelets: 242 10*3/uL (ref 150–400)
RBC: 4.63 MIL/uL (ref 3.87–5.11)
RDW: 16.1 % — AB (ref 11.5–15.5)
WBC: 8.9 10*3/uL (ref 4.0–10.5)

## 2017-11-10 LAB — HCG, QUANTITATIVE, PREGNANCY: hCG, Beta Chain, Quant, S: <1 m[IU]/mL (ref <5)

## 2017-11-10 LAB — I-STAT BETA HCG BLOOD, ED (MC, WL, AP ONLY): I-stat hCG, quantitative: <5 m[IU]/mL (ref <5)

## 2017-11-10 NOTE — Discharge Instructions (Signed)
Negative pregnancy test here in the emergency department.  You have STD testing pending and will be contacted in 2 to 3 days if any of these tests are positive.  Labs overall look good.  If you start having heavier bleeding, worsening abdominal cramps, will lightheaded or faint, have a fever, vomiting or any other new or concerning symptoms return to the emergency department otherwise please follow-up with your OB/GYN.
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
## 2017-11-10 NOTE — ED Triage Notes (Signed)
The pt had 2 pos preg tests   Today she started having abd pain and vaginal  Bleeding  Her lmp was approx 6 weeks ago   maybe

## 2017-11-11 LAB — HIV ANTIBODY (ROUTINE TESTING W REFLEX): HIV Screen 4th Generation wRfx: NONREACTIVE

## 2017-11-13 LAB — GC/CHLAMYDIA PROBE AMP (~~LOC~~) NOT AT ARMC
Chlamydia: NEGATIVE
Neisseria Gonorrhea: NEGATIVE

## 2017-11-13 LAB — RPR: RPR: NONREACTIVE

## 2018-01-07 ENCOUNTER — Encounter (HOSPITAL_COMMUNITY): Payer: Self-pay | Admitting: *Deleted

## 2018-01-07 ENCOUNTER — Other Ambulatory Visit: Payer: Self-pay

## 2018-01-07 ENCOUNTER — Ambulatory Visit (HOSPITAL_COMMUNITY)
Admission: EM | Admit: 2018-01-07 | Discharge: 2018-01-07 | Disposition: A | Discharge status: Home / Self Care | Payer: Medicaid Other | Attending: Family Medicine | Admitting: Family Medicine

## 2018-01-07 DIAGNOSIS — R1909 Other intra-abdominal and pelvic swelling, mass and lump: Secondary | ICD-10-CM

## 2018-01-07 LAB — POCT PREGNANCY, URINE: Preg Test, Ur: NEGATIVE

## 2018-01-07 MED ORDER — IBUPROFEN 800 MG PO TABS: 800.0000 mg | ORAL_TABLET | Freq: Three times a day (TID) | ORAL | 0 refills | Status: DC

## 2018-01-07 NOTE — ED Triage Notes (Signed)
Reports noticing a "bump" to right groin since yesterday.  Denies any redness.  Also menstrual period is late - wished to have pregnancy test.

## 2018-01-07 NOTE — Discharge Instructions (Addendum)
As discussed, mass could be lymph node swelling, abscess. Take ibuprofen 800mg  three times a day for pain. Warm compress and monitor closely. Make appointment with GYN for further evaluation and management needed.
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
## 2018-01-15 ENCOUNTER — Other Ambulatory Visit (INDEPENDENT_AMBULATORY_CARE_PROVIDER_SITE_OTHER): Payer: Medicaid Other

## 2018-01-15 DIAGNOSIS — Z3201 Encounter for pregnancy test, result positive: Secondary | ICD-10-CM | POA: Diagnosis not present

## 2018-01-15 LAB — POCT URINE PREGNANCY: PREG TEST UR: POSITIVE — AB

## 2018-01-15 NOTE — Progress Notes (Signed)
Ms. Krista Combs presents today for UPT. She reports taking a home pregnancy and it was positive. No complaints abdominal pain at this time.  LMP:12/04/2017    OBJECTIVE: Appears well, in no apparent distress.  OB History    Gravida  3   Para  1   Term  1   Preterm  0   AB  2   Living  1     SAB  2   TAB  0   Ectopic  0   Multiple  0   Live Births  1        Obstetric Comments  G1: 10wk by LMP. Pt never went to MD or u/s. Early 2017 G2: 01/2016 missed AB.        Home UPT Result: positive In-Office UPT result:positive I have reviewed the patient's medical, obstetrical, social, and family histories, and medications.   ASSESSMENT: Positive pregnancy test   PLAN [facility]ren[facility]. I have advised patient to start taking prenatal vitamins.

## 2018-02-02 ENCOUNTER — Inpatient Hospital Stay (HOSPITAL_COMMUNITY): Payer: Medicaid Other

## 2018-02-02 ENCOUNTER — Inpatient Hospital Stay (HOSPITAL_COMMUNITY)
Admission: AD | Admit: 2018-02-02 | Discharge: 2018-02-02 | Disposition: A | Discharge status: Home / Self Care | Payer: Medicaid Other | Source: Ambulatory Visit | Attending: Obstetrics & Gynecology | Admitting: Obstetrics & Gynecology

## 2018-02-02 ENCOUNTER — Encounter (HOSPITAL_COMMUNITY): Payer: Self-pay | Admitting: *Deleted

## 2018-02-02 ENCOUNTER — Other Ambulatory Visit: Payer: Self-pay

## 2018-02-02 DIAGNOSIS — Z91018 Allergy to other foods: Secondary | ICD-10-CM | POA: Diagnosis not present

## 2018-02-02 DIAGNOSIS — Z3A08 8 weeks gestation of pregnancy: Secondary | ICD-10-CM

## 2018-02-02 DIAGNOSIS — Z79899 Other long term (current) drug therapy: Secondary | ICD-10-CM | POA: Insufficient documentation

## 2018-02-02 DIAGNOSIS — O039 Complete or unspecified spontaneous abortion without complication: Secondary | ICD-10-CM

## 2018-02-02 DIAGNOSIS — O26891 Other specified pregnancy related conditions, first trimester: Secondary | ICD-10-CM

## 2018-02-02 DIAGNOSIS — R109 Unspecified abdominal pain: Secondary | ICD-10-CM | POA: Diagnosis present

## 2018-02-02 DIAGNOSIS — Z6791 Unspecified blood type, Rh negative: Secondary | ICD-10-CM

## 2018-02-02 DIAGNOSIS — Z881 Allergy status to other antibiotic agents status: Secondary | ICD-10-CM | POA: Insufficient documentation

## 2018-02-02 DIAGNOSIS — Z91010 Allergy to peanuts: Secondary | ICD-10-CM | POA: Diagnosis not present

## 2018-02-02 HISTORY — DX: Anemia, unspecified: D64.9

## 2018-02-02 LAB — CBC
HCT: 37.8 % (ref 36.0–46.0)
HEMOGLOBIN: 12.2 g/dL (ref 12.0–15.0)
MCH: 26.1 pg (ref 26.0–34.0)
MCHC: 32.3 g/dL (ref 30.0–36.0)
MCV: 80.9 fL (ref 78.0–100.0)
Platelets: 257 10*3/uL (ref 150–400)
RBC: 4.67 MIL/uL (ref 3.87–5.11)
RDW: 15.2 % (ref 11.5–15.5)
WBC: 8.6 10*3/uL (ref 4.0–10.5)

## 2018-02-02 LAB — HCG, QUANTITATIVE, PREGNANCY: HCG, BETA CHAIN, QUANT, S: 5456 m[IU]/mL — AB (ref <5)

## 2018-02-02 MED ORDER — IBUPROFEN 800 MG PO TABS: 800.0000 mg | ORAL_TABLET | Freq: Three times a day (TID) | ORAL | 0 refills | Status: DC | PRN

## 2018-02-02 MED ORDER — RHO D IMMUNE GLOBULIN 1500 UNIT/2ML IJ SOSY
300.0000 ug | PREFILLED_SYRINGE | Freq: Once | INTRAMUSCULAR | Status: AC
  Administered 2018-02-02: 300 ug via INTRAMUSCULAR
  Filled 2018-02-02: qty 2

## 2018-02-02 NOTE — MAU Provider Note (Signed)
History     CSN: [id number]  Arrival date and time: 02/02/18 0757   None     Chief Complaint  Patient presents with  . Abdominal Pain  . Vaginal Bleeding   Krista Combs is a 22 y.o. [id number] at 5357w4d who presents today with vaginal bleeding since last night. She used one pad, and then put in a tampon over night. When she woke up the tampon was saturated.   Vaginal Bleeding  The patient's primary symptoms include pelvic pain and vaginal bleeding. This is a new problem. The current episode started yesterday. The problem occurs constantly. The problem has been gradually worsening. Pain severity now: 4/10. The problem affects both sides. She is pregnant. The vaginal discharge was bloody. She has been passing clots (pea sized ). She has not been passing tissue. Nothing aggravates the symptoms. She has tried nothing for the symptoms. Menstrual history: LMP 12/04/17.    OB History    Gravida  4   Para  1   Term  1   Preterm  0   AB  2   Living  1     SAB  2   TAB  0   Ectopic  0   Multiple  0   Live Births  1        Obstetric Comments  G1: 10wk by LMP. Pt never went to MD or u/s. Early 2017 G2: 01/2016 missed AB.         Past Medical History:  Diagnosis Date  . Anemia   . Anxiety   . Depression    no meds, doing ok  . Epilepsy (HCC)   . Seizure disorder (HCC)    last seizure 5 years ago per pt, not on meds  . Seizures (HCC)    epilepsy - can't remember last seizures, years ago  . Thyroid disease    Hyperthyroidism     Past Surgical History:  Procedure Laterality Date  . CESAREAN SECTION N/A 02/05/2017   Procedure: CESAREAN SECTION;  Surgeon: Willodean RosenthalHarraway-Smith, Carolyn, MD;  [facility];  Service: Obstetrics;  Laterality: N/A;  . WISDOM TOOTH EXTRACTION      Family History  Problem Relation Age of Onset  . Diabetes Paternal Grandfather   . Migraines Neg Hx     Social History   Tobacco Use  . Smoking status: Current Every  Day Smoker    Packs/day: 0.20    Types: Cigarettes  . Smokeless tobacco: Never Used  Substance Use Topics  . Alcohol use: No  . Drug use: Yes    Types: Marijuana    Comment: 9/12    Allergies:  Allergies  Allergen Reactions  . Peanut-Containing Drug Products Anaphylaxis  . Pistachio Nut (Diagnostic) Anaphylaxis  . Minocycline Hives  . Cinnamon Nausea And Vomiting    Medications Prior to Admission  Medication Sig Dispense Refill Last Dose  . ibuprofen (ADVIL,MOTRIN) 800 MG tablet Take 1 tablet (800 mg total) by mouth 3 (three) times daily. (Patient not taking: Reported on 01/15/2018) 21 tablet 0 Not Taking    Review of Systems  Genitourinary: Positive for pelvic pain and vaginal bleeding.   Physical Exam   Blood pressure 119/67, pulse 80, temperature 98 F (36.7 C), temperature source Oral, resp. rate 17, weight 80.3 kg, last menstrual period 12/04/2017, SpO2 100 %, not currently breastfeeding.  Physical Exam  Nursing note and vitals reviewed. Constitutional: She is oriented to person, place, and time. She appears well-developed and well-nourished. No  distress.  HENT:  Head: Normocephalic.  Cardiovascular: Normal rate.  Respiratory: Effort normal.  GI: Soft. There is no tenderness. There is no rebound.  Neurological: She is alert and oriented to person, place, and time.  Skin: Skin is warm and dry.  Psychiatric: She has a normal mood and affect.     Results for orders placed or performed during the hospital encounter of 02/02/18 (from the past 24 hour(s))  CBC     Status: None   Collection Time: 02/02/18  8:31 AM  Result Value Ref Range   WBC 8.6 4.0 - 10.5 K/uL   RBC 4.67 3.87 - 5.11 MIL/uL   Hemoglobin 12.2 12.0 - 15.0 g/dL   HCT 08.6 57.8 - 46.9 %   MCV 80.9 78.0 - 100.0 fL   MCH 26.1 26.0 - 34.0 pg   MCHC 32.3 30.0 - 36.0 g/dL   RDW 62.9 52.8 - 41.3 %   Platelets 257 150 - 400 K/uL  hCG, quantitative, pregnancy     Status: Abnormal   Collection Time:  02/02/18  8:31 AM  Result Value Ref Range   hCG, Beta Chain, Quant, S 5,456 (H) <5 mIU/mL  Rh IG workup (includes ABO/Rh)     Status: None (Preliminary result)   Collection Time: 02/02/18  8:31 AM  Result Value Ref Range   Gestational Age(Wks) 8.4    ABO/RH(D) O NEG    Antibody Screen NEG    Unit Number [id number]    Blood Component Type RHIG    Unit division 00    Status of Unit ALLOCATED    Transfusion Status      OK TO TRANSFUSE Performed at [facility], [address], [place], [place] [address]    US Ob Comp Less 14 Wks  Result Date: 02/02/2018 CLINICAL DATA:  Bleeding, cramping EXAM: OBSTETRIC <14 WK ULTRASOUND TECHNIQUE: Transabdominal ultrasound was performed for evaluation of the gestation as well as the maternal uterus and adnexal regions. COMPARISON:  None. FINDINGS: Intrauterine gestational sac: Single Yolk sac:  Visualized Embryo:  Visualized Cardiac Activity: Not visualized Heart Rate:  bpm MSD:   mm    w     d CRL:   8.8 mm   6 w 5 d                  [place] EDC: 09/23/2018 Subchorionic hemorrhage:  Moderate subchorionic hemorrhage. Maternal uterus/adnexae: No adnexal mass.  Trace free fluid. IMPRESSION: Six week 5 day intrauterine pregnancy. No fetal heart tones detected. Findings meet definitive criteria for failed pregnancy. This follows SRU consensus guidelines: Diagnostic Criteria for Nonviable Pregnancy Early in the First Trimester. [facility] Mis [facility] Med [phone]. Moderate subchorionic hemorrhage. Electronically Signed   By: Charlett Nose M.D.   On: 02/02/2018 10:17   MAU Course  Procedures  MDM Reviewed [place] results with the patient and partner. Discussed options including Cytotec v expectant management. Patient has used Cytotec in the past and has had one miscarriage that passed spontaneously. She would prefer for expectant management as she did not have a  Good experience with Cytotec in the past. Reviewed bleeding precautions in detail with the patient. Plan to FU in  the office in one week, and consider Cytotec at that time if she has not passed the pregnancy.   Patient given rhogam here prior to DC   Assessment and Plan   1. Spontaneous abortion   2. [redacted] weeks gestation of pregnancy   3. Rh negative status during pregnancy  in first trimester    DC home Comfort measures reviewed  Bleeding precautions RX: ibuprofen 800mg  PRN #[id number]  Return to MAU as needed   Follow-up Information    [facility] Follow up.   Specialty:  Obstetrics and Gynecology Contact information: [address] [place] [address] [phone]           Thressa Sheller 02/02/2018, 8:25 AM

## 2018-02-02 NOTE — Discharge Instructions (Signed)

## 2018-02-02 NOTE — MAU Note (Signed)
Should be about 8 wks preg.  Last night started having some brownish reddish d/c.  This morning it was bright red w/ little clots in it. Having really bad cramps.

## 2018-02-02 NOTE — MAU Note (Signed)
Urine sent to lab 

## 2018-02-03 LAB — RH IG WORKUP (INCLUDES ABO/RH)
ABO/RH(D): O NEG
Antibody Screen: NEGATIVE
Gestational Age(Wks): 8.4
Unit division: 0

## 2018-02-05 ENCOUNTER — Telehealth: Payer: Self-pay | Admitting: Radiology

## 2018-02-05 NOTE — Telephone Encounter (Signed)
Left voicemail for patient to call [email or web address] to schedule appointment for miscarriage.

## 2018-02-28 ENCOUNTER — Encounter: Payer: Self-pay | Admitting: Family Medicine

## 2018-12-03 ENCOUNTER — Encounter (HOSPITAL_COMMUNITY): Payer: Self-pay

## 2019-03-13 ENCOUNTER — Encounter: Payer: Self-pay | Admitting: Radiology

## 2019-03-13 ENCOUNTER — Encounter: Payer: Self-pay | Admitting: Family Medicine

## 2019-03-13 ENCOUNTER — Ambulatory Visit (INDEPENDENT_AMBULATORY_CARE_PROVIDER_SITE_OTHER): Payer: Medicaid Other | Admitting: Family Medicine

## 2019-03-13 ENCOUNTER — Other Ambulatory Visit: Payer: Self-pay

## 2019-03-13 VITALS — BP 131/83 | HR 94 | Wt 185.0 lb

## 2019-03-13 DIAGNOSIS — O26891 Other specified pregnancy related conditions, first trimester: Secondary | ICD-10-CM | POA: Diagnosis not present

## 2019-03-13 DIAGNOSIS — Z3A09 9 weeks gestation of pregnancy: Secondary | ICD-10-CM

## 2019-03-13 DIAGNOSIS — Z113 Encounter for screening for infections with a predominantly sexual mode of transmission: Secondary | ICD-10-CM

## 2019-03-13 DIAGNOSIS — O34219 Maternal care for unspecified type scar from previous cesarean delivery: Secondary | ICD-10-CM | POA: Diagnosis not present

## 2019-03-13 DIAGNOSIS — O99891 Other specified diseases and conditions complicating pregnancy: Secondary | ICD-10-CM

## 2019-03-13 DIAGNOSIS — Z283 Underimmunization status: Secondary | ICD-10-CM

## 2019-03-13 DIAGNOSIS — Z124 Encounter for screening for malignant neoplasm of cervix: Secondary | ICD-10-CM

## 2019-03-13 DIAGNOSIS — Z6791 Unspecified blood type, Rh negative: Secondary | ICD-10-CM | POA: Insufficient documentation

## 2019-03-13 DIAGNOSIS — F332 Major depressive disorder, recurrent severe without psychotic features: Secondary | ICD-10-CM

## 2019-03-13 DIAGNOSIS — Z3480 Encounter for supervision of other normal pregnancy, unspecified trimester: Secondary | ICD-10-CM | POA: Insufficient documentation

## 2019-03-13 DIAGNOSIS — O09899 Supervision of other high risk pregnancies, unspecified trimester: Secondary | ICD-10-CM

## 2019-03-13 DIAGNOSIS — O26899 Other specified pregnancy related conditions, unspecified trimester: Secondary | ICD-10-CM

## 2019-03-13 MED ORDER — BLOOD PRESSURE KIT DEVI: 1.0000 | 0 refills | Status: DC

## 2019-03-13 NOTE — Progress Notes (Signed)
DATING AND VIABILITY SONOGRAM   Krista Combs is a 23 y.o. year old [id number] with LMP Patient's last menstrual period was 11/27/2018 (approximate). which does not correlate to  [redacted]w[redacted]d weeks gestation.  She has regular menstrual cycles.   She is here today for a confirmatory initial sonogram.    GESTATION: SINGLETON yes     FETAL ACTIVITY:          Heart rate    168          The fetus is active.  GESTATIONAL AGE AND  BIOMETRICS:  Gestational criteria: Estimated Date of Delivery: 10/11/19 by early ultrasound now at [redacted]w[redacted]d  Previous Scans:0  GESTATIONAL SAC            mm          weeks  CROWN RUMP LENGTH           2.95 mm         9.5 weeks                                                   AVERAGE EGA(BY THIS SCAN):  9.5 weeks  WORKING EDD( early ultrasound ):  10/11/2019     TECHNICIAN COMMENTS: Patient informed that the ultrasound is considered a limited obstetric ultrasound and is not intended to be a complete ultrasound exam. Patient also informed that the ultrasound is not being completed with the intent of assessing for fetal or placental anomalies or any pelvic abnormalities. Explained that the purpose of today's ultrasound is to assess for fetal heart rate. Patient acknowledges the purpose of the exam and the limitations of the study.      A copy of this report including all images has been saved and backed up to a second source for retrieval if needed. All measures and details of the anatomical scan, placentation, fluid volume and pelvic anatomy are contained in that report.  Crosby Oyster 03/13/2019 11:33 AM

## 2019-03-13 NOTE — Progress Notes (Signed)
Pt unsure of dating Did already received her FLU shot
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
## 2019-03-14 LAB — OBSTETRIC PANEL, INCLUDING HIV
Antibody Screen: NEGATIVE
Basophils Absolute: 0 10*3/uL (ref 0.0–0.2)
Basos: 0 %
EOS (ABSOLUTE): 0.1 10*3/uL (ref 0.0–0.4)
Eos: 1 %
HIV Screen 4th Generation wRfx: NONREACTIVE
Hematocrit: 39 % (ref 34.0–46.6)
Hemoglobin: 13 g/dL (ref 11.1–15.9)
Hepatitis B Surface Ag: NEGATIVE
Immature Grans (Abs): 0 10*3/uL (ref 0.0–0.1)
Immature Granulocytes: 0 %
Lymphocytes Absolute: 1.7 10*3/uL (ref 0.7–3.1)
Lymphs: 19 %
MCH: 28.8 pg (ref 26.6–33.0)
MCHC: 33.3 g/dL (ref 31.5–35.7)
MCV: 86 fL (ref 79–97)
Monocytes Absolute: 1 10*3/uL — ABNORMAL HIGH (ref 0.1–0.9)
Monocytes: 11 %
Neutrophils Absolute: 6.1 10*3/uL (ref 1.4–7.0)
Neutrophils: 69 %
Platelets: 209 10*3/uL (ref 150–450)
RBC: 4.52 x10E6/uL (ref 3.77–5.28)
RDW: 13.3 % (ref 11.7–15.4)
RPR Ser Ql: NONREACTIVE
Rh Factor: NEGATIVE
Rubella Antibodies, IGG: 1.06 index (ref >0.99)
WBC: 9 10*3/uL (ref 3.4–10.8)

## 2019-03-15 ENCOUNTER — Encounter: Payer: Self-pay | Admitting: Family Medicine

## 2019-03-15 DIAGNOSIS — Z3480 Encounter for supervision of other normal pregnancy, unspecified trimester: Secondary | ICD-10-CM

## 2019-03-15 LAB — URINE CULTURE, OB REFLEX

## 2019-03-15 LAB — CULTURE, OB URINE

## 2019-04-10 ENCOUNTER — Ambulatory Visit (INDEPENDENT_AMBULATORY_CARE_PROVIDER_SITE_OTHER): Payer: Medicaid Other | Admitting: Family Medicine

## 2019-04-10 ENCOUNTER — Encounter: Payer: Self-pay | Admitting: *Deleted

## 2019-04-10 ENCOUNTER — Other Ambulatory Visit: Payer: Self-pay

## 2019-04-10 VITALS — BP 116/78 | HR 98 | Wt 187.0 lb

## 2019-04-10 DIAGNOSIS — O34219 Maternal care for unspecified type scar from previous cesarean delivery: Secondary | ICD-10-CM

## 2019-04-10 DIAGNOSIS — Z3480 Encounter for supervision of other normal pregnancy, unspecified trimester: Secondary | ICD-10-CM

## 2019-04-10 DIAGNOSIS — F1210 Cannabis abuse, uncomplicated: Secondary | ICD-10-CM

## 2019-04-10 DIAGNOSIS — Z6791 Unspecified blood type, Rh negative: Secondary | ICD-10-CM

## 2019-04-10 DIAGNOSIS — O99212 Obesity complicating pregnancy, second trimester: Secondary | ICD-10-CM

## 2019-04-10 DIAGNOSIS — F332 Major depressive disorder, recurrent severe without psychotic features: Secondary | ICD-10-CM

## 2019-04-10 DIAGNOSIS — O99210 Obesity complicating pregnancy, unspecified trimester: Secondary | ICD-10-CM | POA: Insufficient documentation

## 2019-04-10 DIAGNOSIS — Z3A13 13 weeks gestation of pregnancy: Secondary | ICD-10-CM

## 2019-04-10 DIAGNOSIS — O26892 Other specified pregnancy related conditions, second trimester: Secondary | ICD-10-CM

## 2019-04-10 DIAGNOSIS — O26899 Other specified pregnancy related conditions, unspecified trimester: Secondary | ICD-10-CM
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
## 2019-04-17 NOTE — BH Specialist Note (Signed)
Integrated Behavioral Health via Telemedicine Telephone Visit  04/17/2019 Krista Combs [id number]  Number of Integrated Behavioral Health visits: 1 Session Start time: 9:57Session End time: 10:17 Total time: 20  Referring Provider: Federico Flake Type of Visit: Telephone Patient/Family location: Home [facility] Provider location: [facility] All persons participating in visit: Patient [name] and [facility] Jamie McMannes    Confirmed patient's address: Yes  Confirmed patient's phone number: Yes  Any changes to demographics: No   Confirmed patient's insurance: Yes  Any changes to patient's insurance: No   Discussed confidentiality: Yes   I connected with [name]  by a video enabled telemedicine application and verified that I am speaking with the correct person using two identifiers.     I discussed the limitations of evaluation and management by telemedicine and the availability of in person appointments.  I discussed that the purpose of this visit is to provide behavioral health care while limiting exposure to the novel coronavirus.   Discussed there is a possibility of technology failure and discussed alternative modes of communication if that failure occurs.  I discussed that engaging in this telephone visit, they consent to the provision of behavioral healthcare and the services will be billed under their insurance.  Patient and/or legal guardian expressed understanding and consented to telephone visit: Yes   PRESENTING CONCERNS: Patient and/or family reports the following symptoms/concerns: Pt states she has experienced depression in the past, and would like to prevent future episodes, was not treated via medication or therapy in the past. Pt's primary symptom today is fatigue, that she attributes to early pregnancy.  Duration of problem: -; Severity of problem: -  STRENGTHS (Protective Factors/Coping Skills): Social support; positive  outlook  GOALS ADDRESSED: Patient will: 1.  Maintain reduction of symptoms of: depression  2.  Increase knowledge and/or ability of: healthy habits  3.  Demonstrate ability to: Increase healthy adjustment to current life circumstances  INTERVENTIONS: Interventions utilized:  Functional Assessment of ADLs, Psychoeducation and/or Health Education and Link to [employer] Standardized Assessments completed: GAD-7 and PHQ 9  ASSESSMENT: Patient currently experiencing History of depression.   Patient may benefit from psychoeducation and brief therapeutic interventions regarding maintaining reduction of symptoms of depression .  PLAN: 1. Follow up with behavioral health clinician on : As needed, if symptoms increase 2. Behavioral recommendations:  -Continue taking prenatal vitamins as recommended by medical provider -Consider watching [email or web address]'s Virtual Tour of hospital, and becoming familiar with classes available for additional support 3. Referral(s): Integrated [employer] (In Clinic)  I discussed the assessment and treatment plan with the patient and/or parent/guardian. They were provided an opportunity to ask questions and all were answered. They agreed with the plan and demonstrated an understanding of the instructions.   They were advised to call back or seek an in-person evaluation if the symptoms worsen or if the condition fails to improve as anticipated.  Valetta Close McMannes    Depression screen [facility] 2/9 04/22/2019  Decreased Interest 0  Down, Depressed, Hopeless 0  PHQ - 2 Score 0  Altered sleeping 0  Tired, decreased energy 3  Change in appetite 0  Feeling bad or failure about yourself  0  Trouble concentrating 0  Moving slowly or fidgety/restless 0  Suicidal thoughts 0  PHQ-9 Score 3   GAD 7 : Generalized Anxiety Score 04/22/2019  Nervous, Anxious, on Edge 0  Control/stop worrying 0  Worry too much - different things 0  Trouble  relaxing 1  Restless 0  Easily annoyed or irritable 1  Afraid - awful might happen 0  Total GAD 7 Score 2

## 2019-04-22 ENCOUNTER — Other Ambulatory Visit: Payer: Self-pay

## 2019-04-22 ENCOUNTER — Encounter: Payer: Self-pay | Admitting: Radiology

## 2019-04-22 ENCOUNTER — Ambulatory Visit (INDEPENDENT_AMBULATORY_CARE_PROVIDER_SITE_OTHER): Payer: Medicaid Other | Admitting: Clinical

## 2019-04-22 ENCOUNTER — Telehealth: Payer: Self-pay | Admitting: Radiology

## 2019-04-22 DIAGNOSIS — Z8659 Personal history of other mental and behavioral disorders: Secondary | ICD-10-CM

## 2019-04-22 NOTE — Telephone Encounter (Signed)
Patient was informed of Panorama results  

## 2019-05-09 ENCOUNTER — Telehealth (INDEPENDENT_AMBULATORY_CARE_PROVIDER_SITE_OTHER): Payer: Medicaid Other | Admitting: Obstetrics and Gynecology

## 2019-05-09 ENCOUNTER — Other Ambulatory Visit: Payer: Self-pay

## 2019-05-09 ENCOUNTER — Encounter: Payer: Self-pay | Admitting: Obstetrics and Gynecology

## 2019-05-09 DIAGNOSIS — O34219 Maternal care for unspecified type scar from previous cesarean delivery: Secondary | ICD-10-CM

## 2019-05-09 DIAGNOSIS — Z3A17 17 weeks gestation of pregnancy: Secondary | ICD-10-CM

## 2019-05-09 DIAGNOSIS — Z6791 Unspecified blood type, Rh negative: Secondary | ICD-10-CM

## 2019-05-09 DIAGNOSIS — O99212 Obesity complicating pregnancy, second trimester: Secondary | ICD-10-CM

## 2019-05-09 DIAGNOSIS — E669 Obesity, unspecified: Secondary | ICD-10-CM | POA: Insufficient documentation

## 2019-05-09 DIAGNOSIS — Z3480 Encounter for supervision of other normal pregnancy, unspecified trimester: Secondary | ICD-10-CM

## 2019-05-09 DIAGNOSIS — O26892 Other specified pregnancy related conditions, second trimester: Secondary | ICD-10-CM

## 2019-05-09 NOTE — Progress Notes (Signed)
Patient has not been able to obtain a blood pressure cuff due to not having insurance.
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
## 2019-05-14 ENCOUNTER — Other Ambulatory Visit: Payer: Self-pay

## 2019-05-14 ENCOUNTER — Ambulatory Visit (HOSPITAL_COMMUNITY)
Admission: RE | Admit: 2019-05-14 | Discharge: 2019-05-14 | Disposition: A | Discharge status: Home / Self Care | Payer: Medicaid Other | Source: Ambulatory Visit | Attending: Obstetrics and Gynecology | Admitting: Obstetrics and Gynecology

## 2019-05-14 ENCOUNTER — Other Ambulatory Visit (HOSPITAL_COMMUNITY): Payer: Self-pay | Admitting: *Deleted

## 2019-05-14 ENCOUNTER — Other Ambulatory Visit: Payer: Self-pay | Admitting: Family Medicine

## 2019-05-14 DIAGNOSIS — O99342 Other mental disorders complicating pregnancy, second trimester: Secondary | ICD-10-CM

## 2019-05-14 DIAGNOSIS — O99212 Obesity complicating pregnancy, second trimester: Secondary | ICD-10-CM

## 2019-05-14 DIAGNOSIS — Z6791 Unspecified blood type, Rh negative: Secondary | ICD-10-CM

## 2019-05-14 DIAGNOSIS — O26892 Other specified pregnancy related conditions, second trimester: Secondary | ICD-10-CM | POA: Insufficient documentation

## 2019-05-14 DIAGNOSIS — Z3480 Encounter for supervision of other normal pregnancy, unspecified trimester: Secondary | ICD-10-CM | POA: Insufficient documentation

## 2019-05-14 DIAGNOSIS — Z3A18 18 weeks gestation of pregnancy: Secondary | ICD-10-CM

## 2019-05-14 DIAGNOSIS — Z362 Encounter for other antenatal screening follow-up: Secondary | ICD-10-CM

## 2019-05-14 DIAGNOSIS — O34219 Maternal care for unspecified type scar from previous cesarean delivery: Secondary | ICD-10-CM

## 2019-05-14 DIAGNOSIS — O360120 Maternal care for anti-D [Rh] antibodies, second trimester, not applicable or unspecified: Secondary | ICD-10-CM

## 2019-06-06 ENCOUNTER — Telehealth (INDEPENDENT_AMBULATORY_CARE_PROVIDER_SITE_OTHER): Payer: Self-pay | Admitting: Obstetrics & Gynecology

## 2019-06-06 ENCOUNTER — Encounter: Payer: Self-pay | Admitting: Obstetrics & Gynecology

## 2019-06-06 ENCOUNTER — Other Ambulatory Visit: Payer: Self-pay

## 2019-06-06 VITALS — BP 125/72 | HR 72 | Wt 195.0 lb

## 2019-06-06 DIAGNOSIS — Z3A21 21 weeks gestation of pregnancy: Secondary | ICD-10-CM

## 2019-06-06 DIAGNOSIS — Z3480 Encounter for supervision of other normal pregnancy, unspecified trimester: Secondary | ICD-10-CM

## 2019-06-06 DIAGNOSIS — O34219 Maternal care for unspecified type scar from previous cesarean delivery: Secondary | ICD-10-CM

## 2019-06-06 NOTE — Addendum Note (Signed)
Addended by: [name] A on: 06/06/2019 01:33 PM   Modules accepted: Orders

## 2019-06-06 NOTE — Addendum Note (Signed)
Addended by: [name] A on: 06/06/2019 01:37 PM   Modules accepted: Level of Service

## 2019-06-06 NOTE — Patient Instructions (Addendum)
 Second Trimester of Pregnancy The second trimester is from week 14 through week 27 (months 4 through 6). The second trimester is often a time when you feel your best. Your body has adjusted to being pregnant, and you begin to feel better physically. Usually, morning sickness has lessened or quit completely, you may have more energy, and you may have an increase in appetite. The second trimester is also a time when the fetus is growing rapidly. At the end of the sixth month, the fetus is about 9 inches long and weighs about 1 pounds. You will likely begin to feel the baby move (quickening) between 16 and 20 weeks of pregnancy. Body changes during your second trimester Your body continues to go through many changes during your second trimester. The changes vary from woman to woman.  Your weight will continue to increase. You will notice your lower abdomen bulging out.  You may begin to get stretch marks on your hips, abdomen, and breasts.  You may develop headaches that can be relieved by medicines. The medicines should be approved by your health care provider.  You may urinate more often because the fetus is pressing on your bladder.  You may develop or continue to have heartburn as a result of your pregnancy.  You may develop constipation because certain hormones are causing the muscles that push waste through your intestines to slow down.  You may develop hemorrhoids or swollen, bulging veins (varicose veins).  You may have back pain. This is caused by: ? Weight gain. ? Pregnancy hormones that are relaxing the joints in your pelvis. ? A shift in weight and the muscles that support your balance.  Your breasts will continue to grow and they will continue to become tender.  Your gums may bleed and may be sensitive to brushing and flossing.  Dark spots or blotches (chloasma, mask of pregnancy) may develop on your face. This will likely fade after the baby is born.  A dark line from  your belly button to the pubic area (linea nigra) may appear. This will likely fade after the baby is born.  You may have changes in your hair. These can include thickening of your hair, rapid growth, and changes in texture. Some women also have hair loss during or after pregnancy, or hair that feels dry or thin. Your hair will most likely return to normal after your baby is born. What to expect at prenatal visits During a routine prenatal visit:  You will be weighed to make sure you and the fetus are growing normally.  Your blood pressure will be taken.  Your abdomen will be measured to track your baby's growth.  The fetal heartbeat will be listened to.  Any test results from the previous visit will be discussed. Your health care provider may ask you:  How you are feeling.  If you are feeling the baby move.  If you have had any abnormal symptoms, such as leaking fluid, bleeding, severe headaches, or abdominal cramping.  If you are using any tobacco products, including cigarettes, chewing tobacco, and electronic cigarettes.  If you have any questions. Other tests that may be performed during your second trimester include:  Blood tests that check for: ? Low iron levels (anemia). ? High blood sugar that affects pregnant women (gestational diabetes) between 24 and 28 weeks. ? Rh antibodies. This is to check for a protein on red blood cells (Rh factor).  Urine tests to check for infections, diabetes, or protein in   the urine.  An ultrasound to confirm the proper growth and development of the baby.  An amniocentesis to check for possible genetic problems.  Fetal screens for spina bifida and Down syndrome.  HIV (human immunodeficiency virus) testing. Routine prenatal testing includes screening for HIV, unless you choose not to have this test. Follow these instructions at home: Medicines  Follow your health care provider's instructions regarding medicine use. Specific medicines  may be either safe or unsafe to take during pregnancy.  Take a prenatal vitamin that contains at least 600 micrograms (mcg) of folic acid.  If you develop constipation, try taking a stool softener if your health care provider approves. Eating and drinking   Eat a balanced diet that includes fresh fruits and vegetables, whole grains, good sources of protein such as meat, eggs, or tofu, and low-fat dairy. Your health care provider will help you determine the amount of weight gain that is right for you.  Avoid raw meat and uncooked cheese. These carry germs that can cause birth defects in the baby.  If you have low calcium intake from food, talk to your health care provider about whether you should take a daily calcium supplement.  Limit foods that are high in fat and processed sugars, such as fried and sweet foods.  To prevent constipation: ? Drink enough fluid to keep your urine clear or pale yellow. ? Eat foods that are high in fiber, such as fresh fruits and vegetables, whole grains, and beans. Activity  Exercise only as directed by your health care provider. Most women can continue their usual exercise routine during pregnancy. Try to exercise for 30 minutes at least 5 days a week. Stop exercising if you experience uterine contractions.  Avoid heavy lifting, wear low heel shoes, and practice good posture.  A sexual relationship may be continued unless your health care provider directs you otherwise. Relieving pain and discomfort  Wear a good support bra to prevent discomfort from breast tenderness.  Take warm sitz baths to soothe any pain or discomfort caused by hemorrhoids. Use hemorrhoid cream if your health care provider approves.  Rest with your legs elevated if you have leg cramps or low back pain.  If you develop varicose veins, wear support hose. Elevate your feet for 15 minutes, 3-4 times a day. Limit salt in your diet. Prenatal Care  Write down your questions. Take  them to your prenatal visits.  Keep all your prenatal visits as told by your health care provider. This is important. Safety  Wear your seat belt at all times when driving.  Make a list of emergency phone numbers, including numbers for family, friends, the hospital, and police and fire departments. General instructions  Ask your health care provider for a referral to a local prenatal education class. Begin classes no later than the beginning of month 6 of your pregnancy.  Ask for help if you have counseling or nutritional needs during pregnancy. Your health care provider can offer advice or refer you to specialists for help with various needs.  Do not use hot tubs, steam rooms, or saunas.  Do not douche or use tampons or scented sanitary pads.  Do not cross your legs for long periods of time.  Avoid cat litter boxes and soil used by cats. These carry germs that can cause birth defects in the baby and possibly loss of the fetus by miscarriage or stillbirth.  Avoid all smoking, herbs, alcohol, and unprescribed drugs. Chemicals in these products can affect the   formation and growth of the baby.  Do not use any products that contain nicotine or tobacco, such as cigarettes and e-cigarettes. If you need help quitting, ask your health care provider.  Visit your dentist if you have not gone yet during your pregnancy. Use a soft toothbrush to brush your teeth and be gentle when you floss. Contact a health care provider if:  You have dizziness.  You have mild pelvic cramps, pelvic pressure, or nagging pain in the abdominal area.  You have persistent nausea, vomiting, or diarrhea.  You have a bad smelling vaginal discharge.  You have pain when you urinate. Get help right away if:  You have a fever.  You are leaking fluid from your vagina.  You have spotting or bleeding from your vagina.  You have severe abdominal cramping or pain.  You have rapid weight gain or weight loss.  You  have shortness of breath with chest pain.  You notice sudden or extreme swelling of your face, hands, ankles, feet, or legs.  You have not felt your baby move in over an hour.  You have severe headaches that do not go away when you take medicine.  You have vision changes. Summary  The second trimester is from week 14 through week 27 (months 4 through 6). It is also a time when the fetus is growing rapidly.  Your body goes through many changes during pregnancy. The changes vary from woman to woman.  Avoid all smoking, herbs, alcohol, and unprescribed drugs. These chemicals affect the formation and growth your baby.  Do not use any tobacco products, such as cigarettes, chewing tobacco, and e-cigarettes. If you need help quitting, ask your health care provider.  Contact your health care provider if you have any questions. Keep all prenatal visits as told by your health care provider. This is important. This information is not intended to replace advice given to you by your health care provider. Make sure you discuss any questions you have with your health care provider. Document Revised: 08/31/2018 Document Reviewed: 06/14/2016 Elsevier Patient Education  2020 [employer].  Vaginal Birth After Cesarean Delivery  Vaginal birth after cesarean delivery (VBAC) is giving birth vaginally after previously delivering a baby through a cesarean section (C-section). A VBAC may be a safe option for you, depending on your health and other factors. It is important to discuss VBAC with your health care provider early in your pregnancy so you can understand the risks, benefits, and options. Having these discussions early will give you time to make your birth plan. Who are the best candidates for VBAC? The best candidates for VBAC are women who:  Have had one or two prior cesarean deliveries, and the incision made during the delivery was horizontal (low transverse).  Do not have a vertical (classical)  scar on their uterus.  Have not had a tear in the wall of their uterus (uterine rupture).  Plan to have more pregnancies. A VBAC is also more likely to be successful:  In women who have previously given birth vaginally.  When labor starts by itself (spontaneously) before the due date. What are the benefits of VBAC? The benefits of delivering your baby vaginally instead of by a cesarean delivery include:  A shorter hospital stay.  A faster recovery time.  Less pain.  Avoiding risks associated with major surgery, such as infection and blood clots.  Less blood loss and less need for donated blood (transfusions). What are the risks of VBAC? The main  risk of attempting a VBAC is that it may fail, forcing your health care provider to deliver your baby by a C-section. Other risks are rare and include:  Tearing (rupture) of the scar from a past cesarean delivery.  Other risks associated with vaginal deliveries. If a repeat cesarean delivery is needed, the risks include:  Blood loss.  Infection.  Blood clot.  Damage to surrounding organs.  Removal of the uterus (hysterectomy), if it is damaged.  Placenta problems in future pregnancies. What else should I know about my options? Delivering a baby through a VBAC is similar to having a normal spontaneous vaginal delivery. Therefore, it is safe:  To try with twins.  For your health care provider to try to turn the baby from a breech position (external cephalic version) during labor.  With epidural analgesia for pain relief. Consider where you would like to deliver your baby. VBAC should be attempted in facilities where an emergency cesarean delivery can be performed. VBAC is not recommended for home births. Any changes in your health or your baby's health during your pregnancy may make it necessary to change your initial decision about VBAC. Your health care provider may recommend that you do not attempt a VBAC if:  Your baby's  suspected weight is 8.8 lb (4 kg) or more.  You have preeclampsia. This is a condition that causes high blood pressure along with other symptoms, such as swelling and headaches.  You will have VBAC less than 19 months after your cesarean delivery.  You are past your due date.  You need to have labor started (induced) because your cervix is not ready for labor (unfavorable). Where to find more information  American Pregnancy Association: americanpregnancy.org  [employer] of Obst[employer]ians and Gynecologists: acog.org Summary  Vaginal birth after cesarean delivery (VBAC) is giving birth vaginally after previously delivering a baby through a cesarean section (C-section). A VBAC may be a safe option for you, depending on your health and other factors.  Discuss VBAC with your health care provider early in your pregnancy so you can understand the risks, benefits, options, and have plenty of time to make your birth plan.  The main risk of attempting a VBAC is that it may fail, forcing your health care provider to deliver your baby by a C-section. Other risks are rare. This information is not intended to replace advice given to you by your health care provider. Make sure you discuss any questions you have with your health care provider. Document Revised: 09/04/2018 Document Reviewed: 08/16/2016 Elsevier Patient Education  [place].  Postpartum Tubal Ligation Postpartum tubal ligation (PPTL) is a procedure to close the fallopian tubes. This is done so that you cannot get pregnant. When the fallopian tubes are closed, the eggs that the ovaries release cannot enter the uterus, and sperm cannot reach the eggs. PPTL is done right after childbirth or 1-2 days after childbirth, before the uterus returns to its normal location. If you have a cesarean section, it can be performed at the same time as the procedure. Having this done after childbirth does not make your stay in the hospital  longer. PPTL is sometimes called "getting your tubes tied." You should not have this procedure if you want to get pregnant again or if you are unsure about having more children. Tell a health care provider about:  Any allergies you have.  All medicines you are taking, including vitamins, herbs, eye drops, creams, and over-the-counter medicines.  Any problems you or  family members have had with anesthetic medicines.  Any blood disorders you have.  Any surgeries you have had.  Any medical conditions you have or have had.  Any past pregnancies. What are the risks? Generally, this is a safe procedure. However, problems may occur, including:  Infection.  Bleeding.  Injury to other organs in the abdomen.  Side effects from anesthetic medicines.  Failure of the procedure. If this happens, you could get pregnant.  Having a fertilized egg attach outside the uterus (ectopic pregnancy). What happens before the procedure?  Ask your health care provider about: ? How much pain you can expect to have. ? What medicines you will be given for pain, especially if you are planning to breastfeed. What happens during the procedure? If you had a vaginal delivery:  You will be given one or more of the following: ? A medicine to help you relax (sedative). ? A medicine to numb the area (local anesthetic). ? A medicine to make you fall asleep (general anesthetic). ? A medicine that is injected into an area of your body to numb everything below the injection site (regional anesthetic).  If you have been given a general anesthetic, a tube will be put down your throat to help you breathe.  An IV will be inserted into one of your veins.  Your bladder may be emptied with a small tube (catheter).  An incision will be made just below your belly button.  Your fallopian tubes will be located and brought up through the incision.  Your fallopian tubes will be tied off, burned (cauterized), or  blocked with a clip, ring, or clamp. A small part in the center of each fallopian tube may be removed.  The incision will be closed with stitches (sutures).  A bandage (dressing) will be placed over the incision. If you had a cesarean delivery:  Tubal ligation will be done through the incision that was used for the cesarean delivery of your baby.  The incision will be closed with sutures.  A dressing will be placed over the incision. The procedure may vary among health care providers and hospitals. What happens after the procedure?  Your blood pressure, heart rate, breathing rate, and blood oxygen level will be monitored until you leave the hospital.  You will be given pain medicine as needed.  Do not drive for 24 hours if you were given a sedative during your procedure. Summary  Postpartum tubal ligation is a procedure that closes the fallopian tubes so you cannot get pregnant anymore.  This procedure is done while you are still in the hospital after childbirth. If you have a cesarean section, it can be performed at the same time.  Having this done after childbirth does not make your stay in the hospital longer.  Postpartum tubal ligation is considered permanent. You should not have this procedure if you want to get pregnant again or if you are unsure about having more children.  Talk to your health care provider to see if this procedure is right for you. This information is not intended to replace advice given to you by your health care provider. Make sure you discuss any questions you have with your health care provider. Document Revised: 10/22/2018 Document Reviewed: 03/29/2018 Elsevier Patient Education  2020 [employer].

## 2019-06-06 NOTE — Progress Notes (Signed)
Does not have a blood pressure cuff, visit converted to in-person after discussing with Dr. Macon Large. Patient advised to come in today before 4 pm for her prenatal visit. Will give BP cuff at that time.
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
## 2019-06-08 LAB — AFP, SERUM, OPEN SPINA BIFIDA
AFP MoM: 1.1
AFP Value: 68.7 ng/mL
Gest. Age on Collection Date: 21.9 weeks
Maternal Age At EDD: 22.2 yr
OSBR Risk 1 IN: 8933
Test Results:: NEGATIVE
Weight: 195 [lb_av]

## 2019-06-12 ENCOUNTER — Other Ambulatory Visit: Payer: Self-pay

## 2019-06-12 ENCOUNTER — Ambulatory Visit (HOSPITAL_COMMUNITY)
Admission: RE | Admit: 2019-06-12 | Discharge: 2019-06-12 | Disposition: A | Discharge status: Home / Self Care | Payer: Medicaid Other | Source: Ambulatory Visit | Attending: Maternal & Fetal Medicine | Admitting: Maternal & Fetal Medicine

## 2019-06-12 DIAGNOSIS — Z3A22 22 weeks gestation of pregnancy: Secondary | ICD-10-CM

## 2019-06-12 DIAGNOSIS — O360120 Maternal care for anti-D [Rh] antibodies, second trimester, not applicable or unspecified: Secondary | ICD-10-CM | POA: Diagnosis not present

## 2019-06-12 DIAGNOSIS — O99212 Obesity complicating pregnancy, second trimester: Secondary | ICD-10-CM

## 2019-06-12 DIAGNOSIS — O99342 Other mental disorders complicating pregnancy, second trimester: Secondary | ICD-10-CM | POA: Diagnosis not present

## 2019-06-12 DIAGNOSIS — Z362 Encounter for other antenatal screening follow-up: Secondary | ICD-10-CM | POA: Insufficient documentation

## 2019-06-12 DIAGNOSIS — O34219 Maternal care for unspecified type scar from previous cesarean delivery: Secondary | ICD-10-CM

## 2019-07-04 ENCOUNTER — Ambulatory Visit (INDEPENDENT_AMBULATORY_CARE_PROVIDER_SITE_OTHER): Payer: Medicaid Other | Admitting: Family Medicine

## 2019-07-04 ENCOUNTER — Encounter: Payer: Self-pay | Admitting: Family Medicine

## 2019-07-04 ENCOUNTER — Other Ambulatory Visit: Payer: Self-pay

## 2019-07-04 VITALS — BP 108/73 | HR 94 | Wt 194.0 lb

## 2019-07-04 DIAGNOSIS — Z23 Encounter for immunization: Secondary | ICD-10-CM

## 2019-07-04 DIAGNOSIS — O360920 Maternal care for other rhesus isoimmunization, second trimester, not applicable or unspecified: Secondary | ICD-10-CM

## 2019-07-04 DIAGNOSIS — Z3480 Encounter for supervision of other normal pregnancy, unspecified trimester: Secondary | ICD-10-CM

## 2019-07-04 DIAGNOSIS — O34219 Maternal care for unspecified type scar from previous cesarean delivery: Secondary | ICD-10-CM

## 2019-07-04 DIAGNOSIS — Z3A25 25 weeks gestation of pregnancy: Secondary | ICD-10-CM

## 2019-07-04 DIAGNOSIS — Z6791 Unspecified blood type, Rh negative: Secondary | ICD-10-CM

## 2019-07-04 LAB — POCT URINALYSIS DIPSTICK

## 2019-07-04 LAB — CBC
Hematocrit: 33.3 % — ABNORMAL LOW (ref 34.0–46.6)
Hemoglobin: 11.1 g/dL (ref 11.1–15.9)
MCH: 29.1 pg (ref 26.6–33.0)
MCHC: 33.3 g/dL (ref 31.5–35.7)
MCV: 87 fL (ref 79–97)
Platelets: 214 10*3/uL (ref 150–450)
RBC: 3.82 x10E6/uL (ref 3.77–5.28)
RDW: 13 % (ref 11.7–15.4)
WBC: 10.5 10*3/uL (ref 3.4–10.8)

## 2019-07-04 MED ORDER — RHO D IMMUNE GLOBULIN 1500 UNIT/2ML IJ SOSY
300.0000 ug | PREFILLED_SYRINGE | Freq: Once | INTRAMUSCULAR | Status: AC
  Administered 2019-07-04: 09:00:00 300 ug via INTRAMUSCULAR
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
## 2019-07-04 NOTE — Progress Notes (Signed)
Pt experienced a small amount of spotting on 07/03/19

## 2019-07-04 NOTE — Patient Instructions (Addendum)
 Breastfeeding  Choosing to breastfeed is one of the best decisions you can make for yourself and your baby. A change in hormones during pregnancy causes your breasts to make breast milk in your milk-producing glands. Hormones prevent breast milk from being released before your baby is born. They also prompt milk flow after birth. Once breastfeeding has begun, thoughts of your baby, as well as his or her sucking or crying, can stimulate the release of milk from your milk-producing glands. Benefits of breastfeeding Research shows that breastfeeding offers many health benefits for infants and mothers. It also offers a cost-free and convenient way to feed your baby. For your baby  Your first milk (colostrum) helps your baby's digestive system to function better.  Special cells in your milk (antibodies) help your baby to fight off infections.  Breastfed babies are less likely to develop asthma, allergies, obesity, or type 2 diabetes. They are also at lower risk for sudden infant death syndrome (SIDS).  Nutrients in breast milk are better able to meet your baby's needs compared to infant formula.  Breast milk improves your baby's brain development. For you  Breastfeeding helps to create a very special bond between you and your baby.  Breastfeeding is convenient. Breast milk costs nothing and is always available at the correct temperature.  Breastfeeding helps to burn calories. It helps you to lose the weight that you gained during pregnancy.  Breastfeeding makes your uterus return faster to its size before pregnancy. It also slows bleeding (lochia) after you give birth.  Breastfeeding helps to lower your risk of developing type 2 diabetes, osteoporosis, rheumatoid arthritis, cardiovascular disease, and breast, ovarian, uterine, and endometrial cancer later in life. Breastfeeding basics Starting breastfeeding  Find a comfortable place to sit or lie down, with your neck and back  well-supported.  Place a pillow or a rolled-up blanket under your baby to bring him or her to the level of your breast (if you are seated). Nursing pillows are specially designed to help support your arms and your baby while you breastfeed.  Make sure that your baby's tummy (abdomen) is facing your abdomen.  Gently massage your breast. With your fingertips, massage from the outer edges of your breast inward toward the nipple. This encourages milk flow. If your milk flows slowly, you may need to continue this action during the feeding.  Support your breast with 4 fingers underneath and your thumb above your nipple (make the letter "C" with your hand). Make sure your fingers are well away from your nipple and your baby's mouth.  Stroke your baby's lips gently with your finger or nipple.  When your baby's mouth is open wide enough, quickly bring your baby to your breast, placing your entire nipple and as much of the areola as possible into your baby's mouth. The areola is the colored area around your nipple. ? More areola should be visible above your baby's upper lip than below the lower lip. ? Your baby's lips should be opened and extended outward (flanged) to ensure an adequate, comfortable latch. ? Your baby's tongue should be between his or her lower gum and your breast.  Make sure that your baby's mouth is correctly positioned around your nipple (latched). Your baby's lips should create a seal on your breast and be turned out (everted).  It is common for your baby to suck about 2-3 minutes in order to start the flow of breast milk. Latching Teaching your baby how to latch onto your breast properly   is very important. An improper latch can cause nipple pain, decreased milk supply, and poor weight gain in your baby. Also, if your baby is not latched onto your nipple properly, he or she may swallow some air during feeding. This can make your baby fussy. Burping your baby when you switch breasts  during the feeding can help to get rid of the air. However, teaching your baby to latch on properly is still the best way to prevent fussiness from swallowing air while breastfeeding. Signs that your baby has successfully latched onto your nipple  Silent tugging or silent sucking, without causing you pain. Infant's lips should be extended outward (flanged).  Swallowing heard between every 3-4 sucks once your milk has started to flow (after your let-down milk reflex occurs).  Muscle movement above and in front of his or her ears while sucking. Signs that your baby has not successfully latched onto your nipple  Sucking sounds or smacking sounds from your baby while breastfeeding.  Nipple pain. If you think your baby has not latched on correctly, slip your finger into the corner of your baby's mouth to break the suction and place it between your baby's gums. Attempt to start breastfeeding again. Signs of successful breastfeeding Signs from your baby  Your baby will gradually decrease the number of sucks or will completely stop sucking.  Your baby will fall asleep.  Your baby's body will relax.  Your baby will retain a small amount of milk in his or her mouth.  Your baby will let go of your breast by himself or herself. Signs from you  Breasts that have increased in firmness, weight, and size 1-3 hours after feeding.  Breasts that are softer immediately after breastfeeding.  Increased milk volume, as well as a change in milk consistency and color by the fifth day of breastfeeding.  Nipples that are not sore, cracked, or bleeding. Signs that your baby is getting enough milk  Wetting at least 1-2 diapers during the first 24 hours after birth.  Wetting at least 5-6 diapers every 24 hours for the first week after birth. The urine should be clear or pale yellow by the age of 5 days.  Wetting 6-8 diapers every 24 hours as your baby continues to grow and develop.  At least 3 stools in  a 24-hour period by the age of 5 days. The stool should be soft and yellow.  At least 3 stools in a 24-hour period by the age of 7 days. The stool should be seedy and yellow.  No loss of weight greater than 10% of birth weight during the first 3 days of life.  Average weight gain of 4-7 oz (113-198 g) per week after the age of 4 days.  Consistent daily weight gain by the age of 5 days, without weight loss after the age of 2 weeks. After a feeding, your baby may spit up a small amount of milk. This is normal. Breastfeeding frequency and duration Frequent feeding will help you make more milk and can prevent sore nipples and extremely full breasts (breast engorgement). Breastfeed when you feel the need to reduce the fullness of your breasts or when your baby shows signs of hunger. This is called "breastfeeding on demand." Signs that your baby is hungry include:  Increased alertness, activity, or restlessness.  Movement of the head from side to side.  Opening of the mouth when the corner of the mouth or cheek is stroked (rooting).  Increased sucking sounds, smacking lips,   cooing, sighing, or squeaking.  Hand-to-mouth movements and sucking on fingers or hands.  Fussing or crying. Avoid introducing a pacifier to your baby in the first 4-6 weeks after your baby is born. After this time, you may choose to use a pacifier. Research has shown that pacifier use during the first year of a baby's life decreases the risk of sudden infant death syndrome (SIDS). Allow your baby to feed on each breast as long as he or she wants. When your baby unlatches or falls asleep while feeding from the first breast, offer the second breast. Because newborns are often sleepy in the first few weeks of life, you may need to awaken your baby to get him or her to feed. Breastfeeding times will vary from baby to baby. However, the following rules can serve as a guide to help you make sure that your baby is properly  fed:  Newborns (babies 4 weeks of age or younger) may breastfeed every 1-3 hours.  Newborns should not go without breastfeeding for longer than 3 hours during the day or 5 hours during the night.  You should breastfeed your baby a minimum of 8 times in a 24-hour period. Breast milk pumping     Pumping and storing breast milk allows you to make sure that your baby is exclusively fed your breast milk, even at times when you are unable to breastfeed. This is especially important if you go back to work while you are still breastfeeding, or if you are not able to be present during feedings. Your lactation consultant can help you find a method of pumping that works best for you and give you guidelines about how long it is safe to store breast milk. Caring for your breasts while you breastfeed Nipples can become dry, cracked, and sore while breastfeeding. The following recommendations can help keep your breasts moisturized and healthy:  Avoid using soap on your nipples.  Wear a supportive bra designed especially for nursing. Avoid wearing underwire-style bras or extremely tight bras (sports bras).  Air-dry your nipples for 3-4 minutes after each feeding.  Use only cotton bra pads to absorb leaked breast milk. Leaking of breast milk between feedings is normal.  Use lanolin on your nipples after breastfeeding. Lanolin helps to maintain your skin's normal moisture barrier. Pure lanolin is not harmful (not toxic) to your baby. You may also hand express a few drops of breast milk and gently massage that milk into your nipples and allow the milk to air-dry. In the first few weeks after giving birth, some women experience breast engorgement. Engorgement can make your breasts feel heavy, warm, and tender to the touch. Engorgement peaks within 3-5 days after you give birth. The following recommendations can help to ease engorgement:  Completely empty your breasts while breastfeeding or pumping. You may  want to start by applying warm, moist heat (in the shower or with warm, water-soaked hand towels) just before feeding or pumping. This increases circulation and helps the milk flow. If your baby does not completely empty your breasts while breastfeeding, pump any extra milk after he or she is finished.  Apply ice packs to your breasts immediately after breastfeeding or pumping, unless this is too uncomfortable for you. To do this: ? Put ice in a plastic bag. ? Place a towel between your skin and the bag. ? Leave the ice on for 20 minutes, 2-3 times a day.  Make sure that your baby is latched on and positioned properly while breastfeeding.   If engorgement persists after 48 hours of following these recommendations, contact your health care provider or a Advertising copywriter. Overall health care recommendations while breastfeeding  Eat 3 healthy meals and 3 snacks every day. Well-nourished mothers who are breastfeeding need an additional 450-500 calories a day. You can meet this requirement by increasing the amount of a balanced diet that you eat.  Drink enough water to keep your urine pale yellow or clear.  Rest often, relax, and continue to take your prenatal vitamins to prevent fatigue, stress, and low vitamin and mineral levels in your body (nutrient deficiencies).  Do not use any products that contain nicotine or tobacco, such as cigarettes and e-cigarettes. Your baby may be harmed by chemicals from cigarettes that pass into breast milk and exposure to secondhand smoke. If you need help quitting, ask your health care provider.  Avoid alcohol.  Do not use illegal drugs or marijuana.  Talk with your health care provider before taking any medicines. These include over-the-counter and prescription medicines as well as vitamins and herbal supplements. Some medicines that may be harmful to your baby can pass through breast milk.  It is possible to become pregnant while breastfeeding. If birth  control is desired, ask your health care provider about options that will be safe while breastfeeding your baby. Where to find more information: [employer]: www.llli.org Contact a health care provider if:  You feel like you want to stop breastfeeding or have become frustrated with breastfeeding.  Your nipples are cracked or bleeding.  Your breasts are red, tender, or warm.  You have: ? Painful breasts or nipples. ? A swollen area on either breast. ? A fever or chills. ? Nausea or vomiting. ? Drainage other than breast milk from your nipples.  Your breasts do not become full before feedings by the fifth day after you give birth.  You feel sad and depressed.  Your baby is: ? Too sleepy to eat well. ? Having trouble sleeping. ? More than 84 week old and wetting fewer than 6 diapers in a 24-hour period. ? Not gaining weight by 59 days of age.  Your baby has fewer than 3 stools in a 24-hour period.  Your baby's skin or the white parts of his or her eyes become yellow. Get help right away if:  Your baby is overly tired (lethargic) and does not want to wake up and feed.  Your baby develops an unexplained fever. Summary  Breastfeeding offers many health benefits for infant and mothers.  Try to breastfeed your infant when he or she shows early signs of hunger.  Gently tickle or stroke your baby's lips with your finger or nipple to allow the baby to open his or her mouth. Bring the baby to your breast. Make sure that much of the areola is in your baby's mouth. Offer one side and burp the baby before you offer the other side.  Talk with your health care provider or lactation consultant if you have questions or you face problems as you breastfeed. This information is not intended to replace advice given to you by your health care provider. Make sure you discuss any questions you have with your health care provider. Document Revised: 08/03/2017 Document Reviewed:  06/10/2016 Elsevier Patient Education  2020 [employer].  Vaginal Birth After Cesarean Delivery  Vaginal birth after cesarean delivery (VBAC) is giving birth vaginally after previously delivering a baby through a cesarean section (C-section). A VBAC may be a safe option for  you, depending on your health and other factors. It is important to discuss VBAC with your health care provider early in your pregnancy so you can understand the risks, benefits, and options. Having these discussions early will give you time to make your birth plan. Who are the best candidates for VBAC? The best candidates for VBAC are women who: Have had one or two prior cesarean deliveries, and the incision made during the delivery was horizontal (low transverse). Do not have a vertical (classical) scar on their uterus. Have not had a tear in the wall of their uterus (uterine rupture). Plan to have more pregnancies. A VBAC is also more likely to be successful: In women who have previously given birth vaginally. When labor starts by itself (spontaneously) before the due date. What are the benefits of VBAC? The benefits of delivering your baby vaginally instead of by a cesarean delivery include: A shorter hospital stay. A faster recovery time. Less pain. Avoiding risks associated with major surgery, such as infection and blood clots. Less blood loss and less need for donated blood (transfusions). What are the risks of VBAC? The main risk of attempting a VBAC is that it may fail, forcing your health care provider to deliver your baby by a C-section. Other risks are rare and include: Tearing (rupture) of the scar from a past cesarean delivery. Other risks associated with vaginal deliveries. If a repeat cesarean delivery is needed, the risks include: Blood loss. Infection. Blood clot. Damage to surrounding organs. Removal of the uterus (hysterectomy), if it is damaged. Placenta problems in future  pregnancies. What else should I know about my options? Delivering a baby through a VBAC is similar to having a normal spontaneous vaginal delivery. Therefore, it is safe: To try with twins. For your health care provider to try to turn the baby from a breech position (external cephalic version) during labor. With epidural analgesia for pain relief. Consider where you would like to deliver your baby. VBAC should be attempted in facilities where an emergency cesarean delivery can be performed. VBAC is not recommended for home births. Any changes in your health or your baby's health during your pregnancy may make it necessary to change your initial decision about VBAC. Your health care provider may recommend that you do not attempt a VBAC if: Your baby's suspected weight is 8.8 lb (4 kg) or more. You have preeclampsia. This is a condition that causes high blood pressure along with other symptoms, such as swelling and headaches. You will have VBAC less than 19 months after your cesarean delivery. You are past your due date. You need to have labor started (induced) because your cervix is not ready for labor (unfavorable). Where to find more information American Pregnancy Association: americanpregnancy.org [employer]nec[employer]: acog.org Summary Vaginal birth after cesarean delivery (VBAC) is giving birth vaginally after previously delivering a baby through a cesarean section (C-section). A VBAC may be a safe option for you, depending on your health and other factors. Discuss VBAC with your health care provider early in your pregnancy so you can understand the risks, benefits, options, and have plenty of time to make your birth plan. The main risk of attempting a VBAC is that it may fail, forcing your health care provider to deliver your baby by a C-section. Other risks are rare. This information is not intended to replace advice given to you by your health care provider.  Make sure you discuss any questions you have with your health  care provider. Document Revised: 09/04/2018 Document Reviewed: 08/16/2016 Elsevier Patient Education  [place].

## 2019-07-04 NOTE — Addendum Note (Signed)
Addended by: Scheryl Marten on: 07/04/2019 10:35 AM   Modules accepted: Orders

## 2019-07-05 LAB — HIV ANTIBODY (ROUTINE TESTING W REFLEX): HIV Screen 4th Generation wRfx: NONREACTIVE

## 2019-07-05 LAB — GLUCOSE TOLERANCE, 2 HOURS W/ 1HR
Glucose, 1 hour: 115 mg/dL (ref 65–179)
Glucose, 2 hour: 120 mg/dL (ref 65–152)
Glucose, Fasting: 84 mg/dL (ref 65–91)

## 2019-07-05 LAB — RPR: RPR Ser Ql: NONREACTIVE

## 2019-07-05 LAB — ANTIBODY SCREEN: Antibody Screen: NEGATIVE

## 2019-07-06 LAB — CULTURE, OB URINE

## 2019-07-06 LAB — URINE CULTURE, OB REFLEX

## 2019-07-24 ENCOUNTER — Telehealth (INDEPENDENT_AMBULATORY_CARE_PROVIDER_SITE_OTHER): Payer: Medicaid Other | Admitting: Advanced Practice Midwife

## 2019-07-24 ENCOUNTER — Other Ambulatory Visit: Payer: Self-pay

## 2019-07-24 VITALS — BP 113/69

## 2019-07-24 DIAGNOSIS — Z3A28 28 weeks gestation of pregnancy: Secondary | ICD-10-CM

## 2019-07-24 DIAGNOSIS — O34219 Maternal care for unspecified type scar from previous cesarean delivery: Secondary | ICD-10-CM

## 2019-07-24 DIAGNOSIS — R102 Pelvic and perineal pain: Secondary | ICD-10-CM

## 2019-07-24 DIAGNOSIS — O26893 Other specified pregnancy related conditions, third trimester: Secondary | ICD-10-CM

## 2019-07-24 DIAGNOSIS — Z3480 Encounter for supervision of other normal pregnancy, unspecified trimester: Secondary | ICD-10-CM
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
## 2019-07-24 NOTE — Patient Instructions (Signed)
Fetal Movement Counts Patient Name: ________________________________________________ Patient Due Date: ____________________ What is a fetal movement count?  A fetal movement count is the number of times that you feel your baby move during a certain amount of time. This may also be called a fetal kick count. A fetal movement count is recommended for every pregnant woman. You may be asked to start counting fetal movements as early as week 28 of your pregnancy. Pay attention to when your baby is most active. You may notice your baby's sleep and wake cycles. You may also notice things that make your baby move more. You should do a fetal movement count:  When your baby is normally most active.  At the same time each day. A good time to count movements is while you are resting, after having something to eat and drink. How do I count fetal movements? 1. Find a quiet, comfortable area. Sit, or lie down on your side. 2. Write down the date, the start time and stop time, and the number of movements that you felt between those two times. Take this information with you to your health care visits. 3. Write down your start time when you feel the first movement. 4. Count kicks, flutters, swishes, rolls, and jabs. You should feel at least 10 movements. 5. You may stop counting after you have felt 10 movements, or if you have been counting for 2 hours. Write down the stop time. 6. If you do not feel 10 movements in 2 hours, contact your health care provider for further instructions. Your health care provider may want to do additional tests to assess your baby's well-being. Contact a health care provider if:  You feel fewer than 10 movements in 2 hours.  Your baby is not moving like he or she usually does. Date: ____________ Start time: ____________ Stop time: ____________ Movements: ____________ Date: ____________ Start time: ____________ Stop time: ____________ Movements: ____________ Date: ____________  Start time: ____________ Stop time: ____________ Movements: ____________ Date: ____________ Start time: ____________ Stop time: ____________ Movements: ____________ Date: ____________ Start time: ____________ Stop time: ____________ Movements: ____________ Date: ____________ Start time: ____________ Stop time: ____________ Movements: ____________ Date: ____________ Start time: ____________ Stop time: ____________ Movements: ____________ Date: ____________ Start time: ____________ Stop time: ____________ Movements: ____________ Date: ____________ Start time: ____________ Stop time: ____________ Movements: ____________ This information is not intended to replace advice given to you by your health care provider. Make sure you discuss any questions you have with your health care provider. Document Revised: 12/27/2018 Document Reviewed: 12/27/2018 Elsevier Patient Education  [place] of Pregnancy  The third trimester is from week 28 through week 40 (months 7 through 9). This trimester is when your unborn baby (fetus) is growing very fast. At the end of the ninth month, the unborn baby is about 20 inches in length. It weighs about 6-10 pounds. Follow these instructions at home: Medicines  Take over-the-counter and prescription medicines only as told by your doctor. Some medicines are safe and some medicines are not safe during pregnancy.  Take a prenatal vitamin that contains at least 600 micrograms (mcg) of folic acid.  If you have trouble pooping (constipation), take medicine that will make your stool soft (stool softener) if your doctor approves. Eating and drinking   Eat regular, healthy meals.  Avoid raw meat and uncooked cheese.  If you get low calcium from the food you eat, talk to your doctor about taking a daily calcium supplement.  Eat four or five small meals rather than three large meals a day.  Avoid foods that are high in fat and sugars, such as  fried and sweet foods.  To prevent constipation: ? Eat foods that are high in fiber, like fresh fruits and vegetables, whole grains, and beans. ? Drink enough fluids to keep your pee (urine) clear or pale yellow. Activity  Exercise only as told by your doctor. Stop exercising if you start to have cramps.  Avoid heavy lifting, wear low heels, and sit up straight.  Do not exercise if it is too hot, too humid, or if you are in a place of great height (high altitude).  You may continue to have sex unless your doctor tells you not to. Relieving pain and discomfort  Wear a good support bra if your breasts are tender.  Take frequent breaks and rest with your legs raised if you have leg cramps or low back pain.  Take warm water baths (sitz baths) to soothe pain or discomfort caused by hemorrhoids. Use hemorrhoid cream if your doctor approves.  If you develop puffy, bulging veins (varicose veins) in your legs: ? Wear support hose or compression stockings as told by your doctor. ? Raise (elevate) your feet for 15 minutes, 3-4 times a day. ? Limit salt in your food. Safety  Wear your seat belt when driving.  Make a list of emergency phone numbers, including numbers for family, friends, the hospital, and police and fire departments. Preparing for your baby's arrival To prepare for the arrival of your baby:  Take prenatal classes.  Practice driving to the hospital.  Visit the hospital and tour the maternity area.  Talk to your work about taking leave once the baby comes.  Pack your hospital bag.  Prepare the baby's room.  Go to your doctor visits.  Buy a rear-facing car seat. Learn how to install it in your car. General instructions  Do not use hot tubs, steam rooms, or saunas.  Do not use any products that contain nicotine or tobacco, such as cigarettes and e-cigarettes. If you need help quitting, ask your doctor.  Do not drink alcohol.  Do not douche or use tampons or  scented sanitary pads.  Do not cross your legs for long periods of time.  Do not travel for long distances unless you must. Only do so if your doctor says it is okay.  Visit your dentist if you have not gone during your pregnancy. Use a soft toothbrush to brush your teeth. Be gentle when you floss.  Avoid cat litter boxes and soil used by cats. These carry germs that can cause birth defects in the baby and can cause a loss of your baby (miscarriage) or stillbirth.  Keep all your prenatal visits as told by your doctor. This is important. Contact a doctor if:  You are not sure if you are in labor or if your water has broken.  You are dizzy.  You have mild cramps or pressure in your lower belly.  You have a nagging pain in your belly area.  You continue to feel sick to your stomach, you throw up, or you have watery poop.  You have bad smelling fluid coming from your vagina.  You have pain when you pee. Get help right away if:  You have a fever.  You are leaking fluid from your vagina.  You are spotting or bleeding from your vagina.  You have severe belly cramps or pain.  You   lose or gain weight quickly.  You have trouble catching your breath and have chest pain.  You notice sudden or extreme puffiness (swelling) of your face, hands, ankles, feet, or legs.  You have not felt the baby move in over an hour.  You have severe headaches that do not go away with medicine.  You have trouble seeing.  You are leaking, or you are having a gush of fluid, from your vagina before you are 37 weeks.  You have regular belly spasms (contractions) before you are 37 weeks. Summary  The third trimester is from week 28 through week 40 (months 7 through 9). This time is when your unborn baby is growing very fast.  Follow your doctor's advice about medicine, food, and activity.  Get ready for the arrival of your baby by taking prenatal classes, getting all the baby items ready,  preparing the baby's room, and visiting your doctor to be checked.  Get help right away if you are bleeding from your vagina, or you have chest pain and trouble catching your breath, or if you have not felt your baby move in over an hour. This information is not intended to replace advice given to you by your health care provider. Make sure you discuss any questions you have with your health care provider. Document Revised: 08/30/2018 Document Reviewed: 06/14/2016 Elsevier Patient Education  2020 Elsevier Inc.  

## 2019-07-24 NOTE — Progress Notes (Signed)
Having some rib pain and cramping feeling inside vagina  I connected with  Ammy Cardy on 07/24/19 at 10:00 AM EST by telephone and verified that I am speaking with the correct person using two identifiers.   I discussed the limitations, risks, security and privacy concerns of performing an evaluation and management service by telephone and the availability of in person appointments. I also discussed with the patient that there may be a patient responsible charge related to this service. The patient expressed understanding and agreed to proceed.  Scheryl Marten, RN 07/24/2019  10:07 AM

## 2019-08-14 ENCOUNTER — Telehealth: Payer: Self-pay | Admitting: *Deleted

## 2019-08-14 NOTE — Telephone Encounter (Signed)
Pt called concerned about that she may be loosing her mucus plug and had some small spotting when she wiped after using the bathroom. Pt did have sex a few days ago. Pt reports good fetal movement and denies any LOF. Informed pt to continue to monitor the spotting that it could have been from intercourse and if it becomes more or has any bleeding to call the office. Pt verbalized and understands.

## 2019-08-21 ENCOUNTER — Telehealth (INDEPENDENT_AMBULATORY_CARE_PROVIDER_SITE_OTHER): Payer: Self-pay | Admitting: Advanced Practice Midwife

## 2019-08-21 ENCOUNTER — Other Ambulatory Visit: Payer: Self-pay

## 2019-08-21 DIAGNOSIS — E669 Obesity, unspecified: Secondary | ICD-10-CM

## 2019-08-21 DIAGNOSIS — Z3480 Encounter for supervision of other normal pregnancy, unspecified trimester: Secondary | ICD-10-CM

## 2019-08-21 DIAGNOSIS — Z6791 Unspecified blood type, Rh negative: Secondary | ICD-10-CM

## 2019-08-21 DIAGNOSIS — O34219 Maternal care for unspecified type scar from previous cesarean delivery: Secondary | ICD-10-CM

## 2019-08-21 DIAGNOSIS — Z3A32 32 weeks gestation of pregnancy: Secondary | ICD-10-CM

## 2019-08-21 DIAGNOSIS — O99213 Obesity complicating pregnancy, third trimester: Secondary | ICD-10-CM

## 2019-08-21 DIAGNOSIS — O360130 Maternal care for anti-D [Rh] antibodies, third trimester, not applicable or unspecified: Secondary | ICD-10-CM

## 2019-08-21 NOTE — Progress Notes (Signed)
I connected with  Krista Combs on 08/21/19 at  9:00 AM EDT by telephone and verified that I am speaking with the correct person using two identifiers.   I discussed the limitations, risks, security and privacy concerns of performing an evaluation and management service by telephone and the availability of in person appointments. I also discussed with the patient that there may be a patient responsible charge related to this service. The patient expressed understanding and agreed to proceed.  Scheryl Marten, RN 08/21/2019  9:10 AM
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
## 2019-09-04 ENCOUNTER — Other Ambulatory Visit: Payer: Self-pay

## 2019-09-04 ENCOUNTER — Ambulatory Visit (INDEPENDENT_AMBULATORY_CARE_PROVIDER_SITE_OTHER): Payer: Self-pay | Admitting: Advanced Practice Midwife

## 2019-09-04 ENCOUNTER — Encounter: Payer: Self-pay | Admitting: Radiology

## 2019-09-04 VITALS — BP 116/70 | HR 72 | Wt 198.2 lb

## 2019-09-04 DIAGNOSIS — Z3009 Encounter for other general counseling and advice on contraception: Secondary | ICD-10-CM

## 2019-09-04 DIAGNOSIS — Z3480 Encounter for supervision of other normal pregnancy, unspecified trimester: Secondary | ICD-10-CM

## 2019-09-04 DIAGNOSIS — E669 Obesity, unspecified: Secondary | ICD-10-CM

## 2019-09-04 DIAGNOSIS — Z283 Underimmunization status: Secondary | ICD-10-CM

## 2019-09-04 DIAGNOSIS — Z2839 Other underimmunization status: Secondary | ICD-10-CM

## 2019-09-04 DIAGNOSIS — O99891 Other specified diseases and conditions complicating pregnancy: Secondary | ICD-10-CM

## 2019-09-04 DIAGNOSIS — Z3A34 34 weeks gestation of pregnancy: Secondary | ICD-10-CM

## 2019-09-04 DIAGNOSIS — O99213 Obesity complicating pregnancy, third trimester: Secondary | ICD-10-CM

## 2019-09-04 NOTE — Patient Instructions (Signed)
Surgery to Prevent Pregnancy Female sterilization is surgery to prevent pregnancy. In this surgery, the fallopian tubes are either blocked or closed off. When the fallopian tubes are closed, the eggs that the ovaries release cannot enter the uterus, sperm cannot reach the eggs, and you cannot get pregnant. Sterilization is permanent. It should only be done if you are sure that you do not want to be able to have children. What are the sterilization surgery options? There are several kinds of female sterilization surgeries. They include:  Laparoscopic tubal ligation. In this surgery, the fallopian tubes are tied off, sealed with heat, or blocked with a clip, ring, or clamp. A small portion of each fallopian tube may also be removed. This surgery is done through several small cuts (incisions) with special instruments that are inserted into your abdomen.  Postpartum tubal ligation. This is also called a mini-laparotomy. This surgery is done right after childbirth or 1 or 2 days after childbirth. In this surgery, the fallopian tubes are tied off, sealed with heat, or blocked with a clip, ring, or clamp. A small portion of each fallopian tube may also be removed. The surgery is done through a single incision in the abdomen.  Tubal ligation during a C-section. In this surgery, the fallopian tubes are tied off, sealed with heat, or blocked with a clip, ring, or clamp. A small portion of each fallopian tube may also be removed. The surgery is done at the same time as a C-section delivery. Is sterilization safe? Generally, sterilization is safe. Complications are rare. However, there are risks. They include:  Bleeding.  Infection.  Reaction to medicine used during the procedure.  Injury to surrounding organs.  Failure of the procedure. How effective is sterilization? Sterilization is nearly 100% effective, but it can fail. In rare cases, the fallopian tubes can grow back together over time. If this  happens, pregnancy may be possible and you will be able to get pregnant again. Women who have had this procedure have a higher chance of having an ectopic pregnancy. An ectopic pregnancy is a pregnancy that happens outside of the uterus. This kind of pregnancy can lead to serious bleeding if it is not treated. What are the benefits?  It is usually effective for a lifetime.  It is usually safe.  It does not have the drawbacks of other types of birth control in that your hormones are not affected. Because of this, your menstrual periods, sexual desire, and sexual performance will not be affected. What are the drawbacks?  You will need to recover and may have complications after surgery.  If you change your mind and decide that you want to have children, you may not be able to. Sterilization may be reversed, but a reversal is not always successful.  It does not provide protection against STDs (sexually transmitted diseases).  It increases the chance of having an ectopic pregnancy. Follow these instructions at home:  Keep all follow-up visits as told by your health care provider. This is important. Summary  Female sterilization is surgery to prevent pregnancy.  There are different types of female sterilization surgeries.  Sterilization may be reversed, but a reversal is not always successful.  Sterilization does not protect against STDs. This information is not intended to replace advice given to you by your health care provider. Make sure you discuss any questions you have with your health care provider. Document Revised: 10/24/2018 Document Reviewed: 01/19/2018 Elsevier Patient Education  2020 Elsevier Inc.  
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
## 2019-09-10 ENCOUNTER — Encounter: Payer: Self-pay | Admitting: Obstetrics and Gynecology

## 2019-09-18 ENCOUNTER — Encounter: Payer: Self-pay | Admitting: Advanced Practice Midwife

## 2019-09-19 ENCOUNTER — Other Ambulatory Visit: Payer: Self-pay

## 2019-09-19 ENCOUNTER — Ambulatory Visit (INDEPENDENT_AMBULATORY_CARE_PROVIDER_SITE_OTHER): Payer: Medicaid Other | Admitting: Obstetrics and Gynecology

## 2019-09-19 ENCOUNTER — Other Ambulatory Visit (HOSPITAL_COMMUNITY)
Admission: RE | Admit: 2019-09-19 | Discharge: 2019-09-19 | Disposition: A | Discharge status: Home / Self Care | Payer: Self-pay | Source: Ambulatory Visit | Attending: Obstetrics and Gynecology | Admitting: Obstetrics and Gynecology

## 2019-09-19 VITALS — BP 111/71 | HR 94 | Wt 203.0 lb

## 2019-09-19 DIAGNOSIS — Z3A36 36 weeks gestation of pregnancy: Secondary | ICD-10-CM

## 2019-09-19 DIAGNOSIS — O26893 Other specified pregnancy related conditions, third trimester: Secondary | ICD-10-CM | POA: Diagnosis not present

## 2019-09-19 DIAGNOSIS — Z6791 Unspecified blood type, Rh negative: Secondary | ICD-10-CM

## 2019-09-19 DIAGNOSIS — O34219 Maternal care for unspecified type scar from previous cesarean delivery: Secondary | ICD-10-CM

## 2019-09-19 DIAGNOSIS — O368130 Decreased fetal movements, third trimester, not applicable or unspecified: Secondary | ICD-10-CM

## 2019-09-19 DIAGNOSIS — O26899 Other specified pregnancy related conditions, unspecified trimester: Secondary | ICD-10-CM

## 2019-09-19 DIAGNOSIS — Z3480 Encounter for supervision of other normal pregnancy, unspecified trimester: Secondary | ICD-10-CM

## 2019-09-19 DIAGNOSIS — O99213 Obesity complicating pregnancy, third trimester: Secondary | ICD-10-CM | POA: Diagnosis not present

## 2019-09-19 DIAGNOSIS — E669 Obesity, unspecified: Secondary | ICD-10-CM | POA: Diagnosis not present
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
## 2019-09-20 LAB — CERVICOVAGINAL ANCILLARY ONLY
Chlamydia: NEGATIVE
Comment: NEGATIVE
Comment: NORMAL
Neisseria Gonorrhea: NEGATIVE

## 2019-09-21 LAB — STREP GP B NAA: Strep Gp B NAA: NEGATIVE

## 2019-09-24 ENCOUNTER — Other Ambulatory Visit: Payer: Self-pay

## 2019-09-24 ENCOUNTER — Telehealth (INDEPENDENT_AMBULATORY_CARE_PROVIDER_SITE_OTHER): Payer: Self-pay | Admitting: Obstetrics and Gynecology

## 2019-09-24 DIAGNOSIS — Z3A37 37 weeks gestation of pregnancy: Secondary | ICD-10-CM

## 2019-09-24 DIAGNOSIS — Z3480 Encounter for supervision of other normal pregnancy, unspecified trimester: Secondary | ICD-10-CM

## 2019-09-24 DIAGNOSIS — O34219 Maternal care for unspecified type scar from previous cesarean delivery: Secondary | ICD-10-CM

## 2019-09-24 DIAGNOSIS — O26899 Other specified pregnancy related conditions, unspecified trimester: Secondary | ICD-10-CM

## 2019-09-24 DIAGNOSIS — Z6791 Unspecified blood type, Rh negative: Secondary | ICD-10-CM
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
## 2019-09-24 NOTE — Progress Notes (Signed)
Patient will check blood pressure when she get home and upload results into mychart./

## 2019-09-24 NOTE — Progress Notes (Signed)
I connected with  Krista Combs on 09/24/19 at  4:15 PM EDT by telephone and verified that I am speaking with the correct person using two identifiers.   I discussed the limitations, risks, security and privacy concerns of performing an evaluation and management service by telephone and the availability of in person appointments. I also discussed with the patient that there may be a patient responsible charge related to this service. The patient expressed understanding and agreed to proceed.  [name] Emeline Darling, CMA 09/24/2019  1:40 PM

## 2019-09-27 ENCOUNTER — Inpatient Hospital Stay (HOSPITAL_COMMUNITY)
Admission: AD | Admit: 2019-09-27 | Discharge: 2019-09-27 | Disposition: A | Discharge status: Home / Self Care | Payer: Medicaid Other | Attending: Obstetrics and Gynecology | Admitting: Obstetrics and Gynecology

## 2019-09-27 ENCOUNTER — Other Ambulatory Visit: Payer: Self-pay

## 2019-09-27 ENCOUNTER — Encounter (HOSPITAL_COMMUNITY): Payer: Self-pay | Admitting: Obstetrics and Gynecology

## 2019-09-27 DIAGNOSIS — Z3480 Encounter for supervision of other normal pregnancy, unspecified trimester: Secondary | ICD-10-CM

## 2019-09-27 DIAGNOSIS — Z3689 Encounter for other specified antenatal screening: Secondary | ICD-10-CM

## 2019-09-27 DIAGNOSIS — Z3A38 38 weeks gestation of pregnancy: Secondary | ICD-10-CM | POA: Insufficient documentation

## 2019-09-27 DIAGNOSIS — R55 Syncope and collapse: Secondary | ICD-10-CM | POA: Diagnosis not present

## 2019-09-27 DIAGNOSIS — R9431 Abnormal electrocardiogram [ECG] [EKG]: Secondary | ICD-10-CM | POA: Insufficient documentation

## 2019-09-27 DIAGNOSIS — O26893 Other specified pregnancy related conditions, third trimester: Secondary | ICD-10-CM | POA: Insufficient documentation

## 2019-09-27 DIAGNOSIS — Z881 Allergy status to other antibiotic agents status: Secondary | ICD-10-CM | POA: Insufficient documentation

## 2019-09-27 DIAGNOSIS — Z6791 Unspecified blood type, Rh negative: Secondary | ICD-10-CM

## 2019-09-27 DIAGNOSIS — O99013 Anemia complicating pregnancy, third trimester: Secondary | ICD-10-CM | POA: Insufficient documentation

## 2019-09-27 DIAGNOSIS — O26899 Other specified pregnancy related conditions, unspecified trimester: Secondary | ICD-10-CM

## 2019-09-27 DIAGNOSIS — D649 Anemia, unspecified: Secondary | ICD-10-CM | POA: Diagnosis not present

## 2019-09-27 DIAGNOSIS — Z87891 Personal history of nicotine dependence: Secondary | ICD-10-CM | POA: Insufficient documentation

## 2019-09-27 DIAGNOSIS — O34219 Maternal care for unspecified type scar from previous cesarean delivery: Secondary | ICD-10-CM | POA: Insufficient documentation

## 2019-09-27 DIAGNOSIS — Z833 Family history of diabetes mellitus: Secondary | ICD-10-CM | POA: Insufficient documentation

## 2019-09-27 LAB — URINALYSIS, ROUTINE W REFLEX MICROSCOPIC
Bilirubin Urine: NEGATIVE
Glucose, UA: NEGATIVE mg/dL
Hgb urine dipstick: NEGATIVE
Ketones, ur: NEGATIVE mg/dL
Nitrite: NEGATIVE
Protein, ur: 30 mg/dL — AB
Specific Gravity, Urine: 1.024 (ref 1.005–1.030)
pH: 6 (ref 5.0–8.0)

## 2019-09-27 LAB — CBC
HCT: 31.1 % — ABNORMAL LOW (ref 36.0–46.0)
Hemoglobin: 9.6 g/dL — ABNORMAL LOW (ref 12.0–15.0)
MCH: 24.9 pg — ABNORMAL LOW (ref 26.0–34.0)
MCHC: 30.9 g/dL (ref 30.0–36.0)
MCV: 80.6 fL (ref 80.0–100.0)
Platelets: 235 10*3/uL (ref 150–400)
RBC: 3.86 MIL/uL — ABNORMAL LOW (ref 3.87–5.11)
RDW: 14.4 % (ref 11.5–15.5)
WBC: 11 10*3/uL — ABNORMAL HIGH (ref 4.0–10.5)
nRBC: 0 % (ref 0.0–0.2)

## 2019-09-27 MED ORDER — POLYSACCHARIDE IRON COMPLEX 150 MG PO CAPS: 150.0000 mg | ORAL_CAPSULE | Freq: Every day | ORAL | 0 refills | Status: AC

## 2019-09-27 NOTE — MAU Note (Signed)
Pt stated she woke up this morning with hip and back pain and some mild ctx that went away. Has had nausea all day. Was at work and felt lightheaded like she was going to pass out.

## 2019-09-27 NOTE — MAU Provider Note (Addendum)
History     CSN: [id number]  Arrival date and time: 09/27/19 3295   First Provider Initiated Contact with Patient 09/27/19 1713      Chief Complaint  Patient presents with  . Nausea  . Dizziness  . Back Pain   24 y.o. [id number] _0 .0 wks presenting for episode of lightheadedness. 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 She started to feel better after she sat down. She did not have syncope. The episode happened around 1500. She had lunch around 1330 and states she is drinking well. Denies fevers. Feeling good FM. No other c/o.   OB History    Gravida  7   Para  1   Term  1   Preterm  0   AB  5   Living  1     SAB  5   TAB  0   Ectopic  0   Multiple  0   Live Births  1        Obstetric Comments  G1: 10wk by LMP. Pt never went to MD or u/s. Early 2017 G2: 01/2016 missed AB.         Past Medical History:  Diagnosis Date  . Anemia   . Anxiety   . Depression    no meds, doing ok  . Epilepsy ([place])   . Seizure disorder ([place])    last seizure 5 years ago per pt, not on meds  . Seizures ([place])    epilepsy - can't remember last seizures, years ago  . Thyroid disease    Hyperthyroidism     Past Surgical History:  Procedure Laterality Date  . CESAREAN SECTION N/A 02/05/2017   Procedure: CESAREAN SECTION;  Surgeon: Lavonia Drafts, MD;  Location: [facility];  Service: Obstetrics;  Laterality: N/A;  . WISDOM TOOTH EXTRACTION      Family History  Problem Relation Age of Onset  . Diabetes Paternal Grandfather   . Migraines Neg Hx     Social History   Tobacco Use  . Smoking status: Former Smoker    Packs/day: 0.20    Types: Cigarettes  . Smokeless tobacco: Never Used  Substance Use Topics  . Alcohol use: No  . Drug use: Yes    Types: Marijuana    Comment: 9/12    Allergies:  Allergies  Allergen Reactions  . Pistachio Nut (Diagnostic) Anaphylaxis  . Minocycline Hives  . Cinnamon  Nausea And Vomiting    No medications prior to admission.    Review of Systems  Eyes: Negative for visual disturbance.  Gastrointestinal: Positive for nausea. Negative for abdominal pain and vomiting.  Genitourinary: Negative for vaginal bleeding and vaginal discharge.  Neurological: Positive for light-headedness. Negative for syncope and headaches.   Physical Exam   Blood pressure 127/72, pulse 99, resp. rate 18, last menstrual period 11/27/2018, unknown if currently breastfeeding.  Patient Vitals for the past 24 hrs:  BP Pulse Resp  09/27/19 1945 127/72 99 --  09/27/19 1656 133/82 (!) 121 --  09/27/19 1653 122/68 (!) 101 --  09/27/19 1651 120/84 (!) 107 --  09/27/19 1650 117/71 (!) 107 --  09/27/19 1645 123/73 90 18    Physical Exam  Nursing note and vitals reviewed. Constitutional: She appears well-developed and well-nourished. No distress.  HENT:  Head: Normocephalic and atraumatic.  Cardiovascular: Normal rate, regular rhythm and normal heart sounds.  Respiratory: Effort normal. No respiratory distress. She has no wheezes. She has  no rales.  GI: Soft. She exhibits no distension.  Musculoskeletal:        General: Normal range of motion.     Cervical back: Normal range of motion.  Neurological: She is alert.  Skin: Skin is warm and dry.  Psychiatric: She has a normal mood and affect.  EFM: 145 bpm, mod variability, + accels, no decels Toco: rare  Results for orders placed or performed during the hospital encounter of 09/27/19 (from the past 24 hour(s))  Urinalysis, Routine w reflex microscopic     Status: Abnormal   Collection Time: 09/27/19  4:55 PM  Result Value Ref Range   Color, Urine YELLOW YELLOW   APPearance HAZY (A) CLEAR   Specific Gravity, Urine 1.024 1.005 - 1.030   pH 6.0 5.0 - 8.0   Glucose, UA NEGATIVE NEGATIVE mg/dL   Hgb urine dipstick NEGATIVE NEGATIVE   Bilirubin Urine NEGATIVE NEGATIVE   Ketones, ur NEGATIVE NEGATIVE mg/dL   Protein, ur 30  (A) NEGATIVE mg/dL   Nitrite NEGATIVE NEGATIVE   Leukocytes,Ua TRACE (A) NEGATIVE   RBC / HPF 0-5 0 - 5 RBC/hpf   WBC, UA 6-10 0 - 5 WBC/hpf   Bacteria, UA FEW (A) NONE SEEN   Squamous Epithelial / LPF 11-20 0 - 5   Mucus PRESENT   CBC     Status: Abnormal   Collection Time: 09/27/19  6:02 PM  Result Value Ref Range   WBC 11.0 (H) 4.0 - 10.5 K/uL   RBC 3.86 (L) 3.87 - 5.11 MIL/uL   Hemoglobin 9.6 (L) 12.0 - 15.0 g/dL   HCT 31.1 (L) 36.0 - 46.0 %   MCV 80.6 80.0 - 100.0 fL   MCH 24.9 (L) 26.0 - 34.0 pg   MCHC 30.9 30.0 - 36.0 g/dL   RDW 14.4 11.5 - 15.5 %   Platelets 235 150 - 400 K/uL   nRBC 0.0 0.0 - 0.2 %   MAU Course  Procedures  MDM Labs ordered and reviewed. Not orthostatic. EKG read by Dr. Meda Coffee with cards, normal, no acute changes. Anemia noted, recommend supplementation. Suspect episode caused by orthostatic hypotension. Discussed increased hydration and sit when possible. Stable for discharge home.   Assessment and Plan  [redacted] weeks gestation Near syncope Anemia in pregnancy NST reactive Discharge home Follow up at [facility] as scheduled Rx Ferrex Return precautions  Allergies as of 09/27/2019      Reactions   Pistachio Nut (diagnostic) Anaphylaxis   Minocycline Hives   Cinnamon Nausea And Vomiting      Medication List    TAKE these medications   Blood Pressure Kit Devi 1 Device by Does not apply route once a week. To be monitored weekly from home   iron polysaccharides 150 MG capsule Commonly known as: NIFEREX Take 1 capsule (150 mg total) by mouth daily.   prenatal vitamin w/FE, FA 27-1 MG Tabs tablet Take 1 tablet by mouth daily at 12 noon.      Julianne Handler, CNM 09/27/2019, 9:22 PM

## 2019-09-27 NOTE — Discharge Instructions (Signed)
Near-Syncope Near-syncope is when you suddenly get weak or dizzy, or you feel like you might pass out (faint). This may also be called presyncope. This is due to a lack of blood flow to the brain. During an episode of near-syncope, you may:  Feel dizzy, weak, or light-headed.  Feel sick to your stomach (nauseous).  See all white or all black.  See spots.  Have cold, clammy skin. This condition is caused by a sudden decrease in blood flow to the brain. This decrease can result from various causes, but most of those causes are not dangerous. However, near-syncope may be a sign of a serious medical problem, so it is important to seek medical care. Follow these instructions at home: Medicines  Take over-the-counter and prescription medicines only as told by your doctor.  If you are taking blood pressure or heart medicine, get up slowly and spend many minutes getting ready to sit and then stand. This can help with dizziness. General instructions  Be aware of any changes in your symptoms.  Talk with your doctor about your symptoms. You may need to have testing to find the cause of your near-syncope.  If you start to feel like you might pass out, lie down right away. Raise (elevate) your feet above the level of your heart. Breathe deeply and steadily. Wait until all of the symptoms are gone.  Have someone stay with you until you feel stable.  Do not drive, use machinery, or play sports until your doctor says it is okay.  Drink enough fluid to keep your pee (urine) pale yellow.  Keep all follow-up visits as told by your doctor. This is important. Get help right away if you:  Have a seizure.  Have pain in your: ? Chest. ? Belly (abdomen). ? Back.  Faint once or more than once.  Have a very bad headache.  Are bleeding from your mouth or butt.  Have black or tarry poop (stool).  Have a very fast or uneven heartbeat (palpitations).  Are mixed up (confused).  Have trouble  walking.  Are very weak.  Have trouble seeing. These symptoms may be an emergency. Do not wait to see if the symptoms will go away. Get medical help right away. Call your local emergency services (911 in the [place] Do not drive yourself to the hospital. Summary  Near-syncope is when you suddenly get weak or dizzy, or you feel like you might pass out (faint).  This condition is caused by a lack of blood flow to the brain.  Near-syncope may be a sign of a serious medical problem, so it is important to seek medical care. This information is not intended to replace advice given to you by your health care provider. Make sure you discuss any questions you have with your health care provider. Document Revised: 08/31/2018 Document Reviewed: 03/28/2018 Elsevier Patient Education  2020 [employer].   Pregnancy and Anemia  Anemia is a condition in which the concentration of red blood cells, or hemoglobin, in the blood is below normal. Hemoglobin is a substance in red blood cells that carries oxygen to the tissues of the body. Anemia results when enough oxygen does not reach these tissues. Anemia is common during pregnancy because the woman's body needs more blood volume and blood cells to provide nutrition to the fetus. The fetus needs iron and folic acid as it is developing. Your body may not produce enough red blood cells because of this. Also, during pregnancy, the liquid part  of the blood (plasma) increases by about 30-50%, and the red blood cells increase by only 20%. This lowers the concentration of the red blood cells and creates a natural anemia-like situation. What are the causes? The most common cause of anemia during pregnancy is not having enough iron in the body to make red blood cells (iron deficiency anemia). Other causes may include:  Folic acid deficiency.  Vitamin B12 deficiency.  Certain prescription or over-the-counter medicines.  Certain medical conditions or infections  that destroy red blood cells.  A low platelet count and bleeding caused by antibodies that go through the placenta to the fetus from the mother's blood. What are the signs or symptoms? Mild anemia may not be noticeable. If it becomes severe, symptoms may include:  Feeling tired (fatigue).  Shortness of breath, especially during activity.  Weakness.  Fainting.  Pale looking skin.  Headaches.  A fast or irregular heartbeat (palpitations).  Dizziness. How is this diagnosed? This condition may be diagnosed based on:  Your medical history and a physical exam.  Blood tests. How is this treated? Treatment for anemia during pregnancy depends on the cause of the anemia. Treatment can include:  Dietary changes.  Supplements of iron, vitamin B12, or folic acid.  A blood transfusion. This may be needed if anemia is severe.  Hospitalization. This may be needed if there is a lot of blood loss or severe anemia. Follow these instructions at home:  Follow recommendations from your dietitian or health care provider about changing your diet.  Increase your vitamin C intake. This will help the stomach absorb more iron. Some foods that are high in vitamin C include: ? Oranges. ? Peppers. ? Tomatoes. ? Mangoes.  Eat a diet rich in iron. This would include foods such as: ? Liver. ? Beef. ? Eggs. ? Whole grains. ? Spinach. ? Dried fruit.  Take iron and vitamins as told by your health care provider.  Eat green leafy vegetables. These are a good source of folic acid.  Keep all follow-up visits as told by your health care provider. This is important. Contact a health care provider if:  You have frequent or lasting headaches.  You look pale.  You bruise easily. Get help right away if:  You have extreme weakness, shortness of breath, or chest pain.  You become dizzy or have trouble concentrating.  You have heavy vaginal bleeding.  You develop a rash.  You have bloody  or black, tarry stools.  You faint.  You vomit up blood.  You vomit repeatedly.  You have abdominal pain.  You have a fever.  You are dehydrated. Summary  Anemia is a condition in which the concentration of red blood cells or hemoglobin in the blood is below normal.  Anemia is common during pregnancy because the woman's body needs more blood volume and blood cells to provide nutrition to the fetus.  The most common cause of anemia during pregnancy is not having enough iron in the body to make red blood cells (iron deficiency anemia).  Mild anemia may not be noticeable. If it becomes severe, symptoms may include feeling tired and weak. This information is not intended to replace advice given to you by your health care provider. Make sure you discuss any questions you have with your health care provider. Document Revised: 10/23/2018 Document Reviewed: 06/14/2016 Elsevier Patient Education  2020 [employer].

## 2019-10-02 ENCOUNTER — Ambulatory Visit (INDEPENDENT_AMBULATORY_CARE_PROVIDER_SITE_OTHER): Payer: Self-pay | Admitting: Advanced Practice Midwife

## 2019-10-02 ENCOUNTER — Other Ambulatory Visit: Payer: Self-pay

## 2019-10-02 VITALS — BP 123/79 | HR 97 | Wt 205.0 lb

## 2019-10-02 DIAGNOSIS — O34219 Maternal care for unspecified type scar from previous cesarean delivery: Secondary | ICD-10-CM

## 2019-10-02 DIAGNOSIS — Z6791 Unspecified blood type, Rh negative: Secondary | ICD-10-CM

## 2019-10-02 DIAGNOSIS — Z3A38 38 weeks gestation of pregnancy: Secondary | ICD-10-CM

## 2019-10-02 DIAGNOSIS — O99343 Other mental disorders complicating pregnancy, third trimester: Secondary | ICD-10-CM

## 2019-10-02 DIAGNOSIS — Z3480 Encounter for supervision of other normal pregnancy, unspecified trimester: Secondary | ICD-10-CM

## 2019-10-02 DIAGNOSIS — O99213 Obesity complicating pregnancy, third trimester: Secondary | ICD-10-CM

## 2019-10-02 NOTE — Patient Instructions (Signed)
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
## 2019-10-08 ENCOUNTER — Encounter (HOSPITAL_COMMUNITY): Payer: Self-pay

## 2019-10-08 ENCOUNTER — Ambulatory Visit (INDEPENDENT_AMBULATORY_CARE_PROVIDER_SITE_OTHER): Payer: Self-pay | Admitting: Obstetrics & Gynecology

## 2019-10-08 ENCOUNTER — Other Ambulatory Visit: Payer: Self-pay

## 2019-10-08 VITALS — BP 133/82 | HR 101 | Wt 206.0 lb

## 2019-10-08 DIAGNOSIS — Z3480 Encounter for supervision of other normal pregnancy, unspecified trimester: Secondary | ICD-10-CM

## 2019-10-08 DIAGNOSIS — O34219 Maternal care for unspecified type scar from previous cesarean delivery: Secondary | ICD-10-CM

## 2019-10-08 NOTE — Patient Instructions (Signed)
Krista Combs  10/08/2019   Your procedure is scheduled on:  10/10/2019  Arrive at 0800 at Entrance C on [address] at [facility]  and [employer]. You are invited to use the FREE valet parking or use the Visitor's parking deck.  Pick up the phone at the desk and dial [phone].  Call this number if you have problems the morning of surgery: [phone]  Remember:   Do not eat food:(After Midnight) Desps de medianoche.  Do not drink clear liquids: (After Midnight) Desps de medianoche.  Take these medicines the morning of surgery with A SIP OF WATER:  none   Do not wear jewelry, make-up or nail polish.  Do not wear lotions, powders, or perfumes. Do not wear deodorant.  Do not shave 48 hours prior to surgery.  Do not bring valuables to the hospital.  [facility] is not   responsible for any belongings or valuables brought to the hospital.  Contacts, dentures or bridgework may not be worn into surgery.  Leave suitcase in the car. After surgery it may be brought to your room.  For patients admitted to the hospital, checkout time is 11:00 AM the day of              discharge.      Please read over the following fact sheets that you were given:     Preparing for Surgery

## 2019-10-08 NOTE — Patient Instructions (Signed)
Cesarean Delivery Cesarean birth, or cesarean delivery, is the surgical delivery of a baby through an incision in the abdomen and the uterus. This may be referred to as a C-section. This procedure may be scheduled ahead of time, or it may be done in an emergency situation. Tell a health care provider about:  Any allergies you have.  All medicines you are taking, including vitamins, herbs, eye drops, creams, and over-the-counter medicines.  Any problems you or family members have had with anesthetic medicines.  Any blood disorders you have.  Any surgeries you have had.  Any medical conditions you have.  Whether you or any members of your family have a history of deep vein thrombosis (DVT) or pulmonary embolism (PE). What are the risks? Generally, this is a safe procedure. However, problems may occur, including:  Infection.  Bleeding.  Allergic reactions to medicines.  Damage to other structures or organs.  Blood clots.  Injury to your baby. What happens before the procedure? General instructions  Follow instructions from your health care provider about eating or drinking restrictions.  If you know that you are going to have a cesarean delivery, do not shave your pubic area. Shaving before the procedure may increase your risk of infection.  Plan to have someone take you home from the hospital.  Ask your health care provider what steps will be taken to prevent infection. These may include: ? Removing hair at the surgery site. ? Washing skin with a germ-killing soap. ? Taking antibiotic medicine.  Depending on the reason for your cesarean delivery, you may have a physical exam or additional testing, such as an ultrasound.  You may have your blood or urine tested. Questions for your health care provider  Ask your health care provider about: ? Changing or stopping your regular medicines. This is especially important if you are taking diabetes medicines or blood  thinners. ? Your pain management plan. This is especially important if you plan to breastfeed your baby. ? How long you will be in the hospital after the procedure. ? Any concerns you may have about receiving blood products, if you need them during the procedure. ? Cord blood banking, if you plan to collect your baby's umbilical cord blood.  You may also want to ask your health care provider: ? Whether you will be able to hold or breastfeed your baby while you are still in the operating room. ? Whether your baby can stay with you immediately after the procedure and during your recovery. ? Whether a family member or a person of your choice can go with you into the operating room and stay with you during the procedure, immediately after the procedure, and during your recovery. What happens during the procedure?   An IV will be inserted into one of your veins.  Fluid and medicines, such as antibiotics, will be given before the surgery.  Fetal monitors will be placed on your abdomen to check your baby's heart rate.  You may be given a special warming gown to wear to keep your temperature stable.  A catheter may be inserted into your bladder through your urethra. This drains your urine during the procedure.  You may be given one or more of the following: ? A medicine to numb the area (local anesthetic). ? A medicine to make you fall asleep (general anesthetic). ? A medicine (regional anesthetic) that is injected into your back or through a small thin tube placed in your back (spinal anesthetic or epidural anesthetic).   This numbs everything below the injection site and allows you to stay awake during your procedure. If this makes you feel nauseous, tell your health care provider. Medicines will be available to help reduce any nausea you may feel.  An incision will be made in your abdomen, and then in your uterus.  If you are awake during your procedure, you may feel tugging and pulling in  your abdomen, but you should not feel pain. If you feel pain, tell your health care provider immediately.  Your baby will be removed from your uterus. You may feel more pressure or pushing while this happens.  Immediately after birth, your baby will be dried and kept warm. You may be able to hold and breastfeed your baby.  The umbilical cord may be clamped and cut during this time. This usually occurs after waiting a period of 1-2 minutes after delivery.  Your placenta will be removed from your uterus.  Your incisions will be closed with stitches (sutures). Staples, skin glue, or adhesive strips may also be applied to the incision in your abdomen.  Bandages (dressings) may be placed over the incision in your abdomen. The procedure may vary among health care providers and hospitals. What happens after the procedure?  Your blood pressure, heart rate, breathing rate, and blood oxygen level will be monitored until you are discharged from the hospital.  You may continue to receive fluids and medicines through an IV.  You will have some pain. Medicines will be available to help control your pain.  To help prevent blood clots: ? You may be given medicines. ? You may have to wear compression stockings or devices. ? You will be encouraged to walk around when you are able.  Hospital staff will encourage and support bonding with your baby. Your hospital may have you and your baby to stay in the same room (rooming in) during your hospital stay to encourage successful bonding and breastfeeding.  You may be encouraged to cough and breathe deeply often. This helps to prevent lung problems.  If you have a catheter draining your urine, it will be removed as soon as possible after your procedure. Summary  Cesarean birth, or cesarean delivery, is the surgical delivery of a baby through an incision in the abdomen and the uterus.  Follow instructions from your health care provider about eating or  drinking restrictions before the procedure.  You will have some pain after the procedure. Medicines will be available to help control your pain.  Hospital staff will encourage and support bonding with your baby after the procedure. Your hospital may have you and your baby to stay in the same room (rooming in) during your hospital stay to encourage successful bonding and breastfeeding. This information is not intended to replace advice given to you by your health care provider. Make sure you discuss any questions you have with your health care provider. Document Revised: 11/13/2017 Document Reviewed: 11/13/2017 Elsevier Patient Education  2020 Elsevier Inc.  
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
## 2019-10-09 ENCOUNTER — Other Ambulatory Visit (HOSPITAL_COMMUNITY)
Admission: RE | Admit: 2019-10-09 | Discharge: 2019-10-09 | Disposition: A | Discharge status: Home / Self Care | Payer: HRSA Program | Source: Ambulatory Visit | Attending: Obstetrics & Gynecology | Admitting: Obstetrics & Gynecology

## 2019-10-09 ENCOUNTER — Other Ambulatory Visit: Payer: Self-pay

## 2019-10-09 DIAGNOSIS — Z01812 Encounter for preprocedural laboratory examination: Secondary | ICD-10-CM | POA: Insufficient documentation

## 2019-10-09 DIAGNOSIS — Z20822 Contact with and (suspected) exposure to covid-19: Secondary | ICD-10-CM | POA: Diagnosis not present

## 2019-10-09 LAB — CBC
HCT: 31.7 % — ABNORMAL LOW (ref 36.0–46.0)
Hemoglobin: 9.9 g/dL — ABNORMAL LOW (ref 12.0–15.0)
MCH: 24.8 pg — ABNORMAL LOW (ref 26.0–34.0)
MCHC: 31.2 g/dL (ref 30.0–36.0)
MCV: 79.3 fL — ABNORMAL LOW (ref 80.0–100.0)
Platelets: 210 10*3/uL (ref 150–400)
RBC: 4 MIL/uL (ref 3.87–5.11)
RDW: 14.8 % (ref 11.5–15.5)
WBC: 10 10*3/uL (ref 4.0–10.5)
nRBC: 0 % (ref 0.0–0.2)

## 2019-10-09 LAB — ABO/RH: ABO/RH(D): O NEG

## 2019-10-09 LAB — RPR: RPR Ser Ql: NONREACTIVE

## 2019-10-10 ENCOUNTER — Other Ambulatory Visit: Payer: Self-pay

## 2019-10-10 ENCOUNTER — Encounter (HOSPITAL_COMMUNITY): Payer: Self-pay | Admitting: Obstetrics & Gynecology

## 2019-10-10 ENCOUNTER — Inpatient Hospital Stay (HOSPITAL_COMMUNITY): Payer: Medicaid Other | Admitting: Anesthesiology

## 2019-10-10 ENCOUNTER — Inpatient Hospital Stay (HOSPITAL_COMMUNITY)
Admission: RE | Admit: 2019-10-10 | Discharge: 2019-10-12 | DRG: 784 | Disposition: A | Discharge status: Home / Self Care | Payer: Medicaid Other | Attending: Obstetrics & Gynecology | Admitting: Obstetrics & Gynecology

## 2019-10-10 ENCOUNTER — Encounter (HOSPITAL_COMMUNITY)
Admission: RE | Disposition: A | Discharge status: Home / Self Care | Payer: Self-pay | Source: Home / Self Care | Attending: Obstetrics & Gynecology

## 2019-10-10 ENCOUNTER — Inpatient Hospital Stay (HOSPITAL_COMMUNITY): Admission: AD | Admit: 2019-10-10 | Payer: Self-pay | Source: Home / Self Care | Admitting: Obstetrics & Gynecology

## 2019-10-10 DIAGNOSIS — O9081 Anemia of the puerperium: Secondary | ICD-10-CM | POA: Diagnosis not present

## 2019-10-10 DIAGNOSIS — Z87891 Personal history of nicotine dependence: Secondary | ICD-10-CM | POA: Diagnosis not present

## 2019-10-10 DIAGNOSIS — O99013 Anemia complicating pregnancy, third trimester: Secondary | ICD-10-CM | POA: Diagnosis present

## 2019-10-10 DIAGNOSIS — D62 Acute posthemorrhagic anemia: Secondary | ICD-10-CM | POA: Diagnosis not present

## 2019-10-10 DIAGNOSIS — Z9851 Tubal ligation status: Secondary | ICD-10-CM

## 2019-10-10 DIAGNOSIS — Z6791 Unspecified blood type, Rh negative: Secondary | ICD-10-CM | POA: Diagnosis not present

## 2019-10-10 DIAGNOSIS — O99214 Obesity complicating childbirth: Secondary | ICD-10-CM | POA: Diagnosis present

## 2019-10-10 DIAGNOSIS — O26893 Other specified pregnancy related conditions, third trimester: Secondary | ICD-10-CM | POA: Diagnosis present

## 2019-10-10 DIAGNOSIS — Z302 Encounter for sterilization: Secondary | ICD-10-CM

## 2019-10-10 DIAGNOSIS — Z98891 History of uterine scar from previous surgery: Secondary | ICD-10-CM

## 2019-10-10 DIAGNOSIS — Z3480 Encounter for supervision of other normal pregnancy, unspecified trimester: Secondary | ICD-10-CM

## 2019-10-10 DIAGNOSIS — Z3A39 39 weeks gestation of pregnancy: Secondary | ICD-10-CM

## 2019-10-10 DIAGNOSIS — O34211 Maternal care for low transverse scar from previous cesarean delivery: Principal | ICD-10-CM

## 2019-10-10 DIAGNOSIS — E669 Obesity, unspecified: Secondary | ICD-10-CM | POA: Diagnosis present

## 2019-10-10 LAB — COMPREHENSIVE METABOLIC PANEL
ALT: 13 U/L (ref 0–44)
AST: 14 U/L — ABNORMAL LOW (ref 15–41)
Albumin: 2.2 g/dL — ABNORMAL LOW (ref 3.5–5.0)
Alkaline Phosphatase: 290 U/L — ABNORMAL HIGH (ref 38–126)
Anion gap: 9 (ref 5–15)
BUN: 5 mg/dL — ABNORMAL LOW (ref 6–20)
CO2: 20 mmol/L — ABNORMAL LOW (ref 22–32)
Calcium: 8.6 mg/dL — ABNORMAL LOW (ref 8.9–10.3)
Chloride: 108 mmol/L (ref 98–111)
Creatinine, Ser: 0.45 mg/dL (ref 0.44–1.00)
GFR calc Af Amer: >60 mL/min (ref >60)
GFR calc non Af Amer: >60 mL/min (ref >60)
Glucose, Bld: 85 mg/dL (ref 70–99)
Potassium: 3.6 mmol/L (ref 3.5–5.1)
Sodium: 137 mmol/L (ref 135–145)
Total Bilirubin: 0.5 mg/dL (ref 0.3–1.2)
Total Protein: 5.5 g/dL — ABNORMAL LOW (ref 6.5–8.1)

## 2019-10-10 LAB — TYPE AND SCREEN
ABO/RH(D): O NEG
Antibody Screen: NEGATIVE

## 2019-10-10 LAB — SARS CORONAVIRUS 2 (TAT 6-24 HRS): SARS Coronavirus 2: NEGATIVE

## 2019-10-10 SURGERY — Surgical Case
Anesthesia: Spinal | Site: Abdomen | Wound class: Clean Contaminated

## 2019-10-10 MED ORDER — SOD CITRATE-CITRIC ACID 500-334 MG/5ML PO SOLN
30.0000 mL | ORAL | Status: AC
  Administered 2019-10-10: 30 mL via ORAL

## 2019-10-10 MED ORDER — HYDROMORPHONE HCL 1 MG/ML IJ SOLN: 1.0000 mg | INTRAMUSCULAR | Status: DC | PRN

## 2019-10-10 MED ORDER — FERROUS SULFATE 325 (65 FE) MG PO TABS
325.0000 mg | ORAL_TABLET | Freq: Two times a day (BID) | ORAL | Status: DC
Start: 2019-10-10 — End: 2019-10-10

## 2019-10-10 MED ORDER — DIPHENHYDRAMINE HCL 25 MG PO CAPS: 25.0000 mg | ORAL_CAPSULE | Freq: Four times a day (QID) | ORAL | Status: DC | PRN

## 2019-10-10 MED ORDER — ENOXAPARIN SODIUM 40 MG/0.4ML ~~LOC~~ SOLN
40.0000 mg | SUBCUTANEOUS | Status: DC
  Administered 2019-10-11 – 2019-10-12 (×2): 40 mg via SUBCUTANEOUS
  Filled 2019-10-10 (×2): qty 0.4

## 2019-10-10 MED ORDER — DIBUCAINE (PERIANAL) 1 % EX OINT: 1.0000 "application " | TOPICAL_OINTMENT | CUTANEOUS | Status: DC | PRN

## 2019-10-10 MED ORDER — TRANEXAMIC ACID-NACL 1000-0.7 MG/100ML-% IV SOLN
1000.0000 mg | INTRAVENOUS | Status: AC
  Administered 2019-10-10: 1000 mg via INTRAVENOUS

## 2019-10-10 MED ORDER — SCOPOLAMINE 1 MG/3DAYS TD PT72
1.0000 | MEDICATED_PATCH | Freq: Once | TRANSDERMAL | Status: DC
  Administered 2019-10-10: 1.5 mg via TRANSDERMAL

## 2019-10-10 MED ORDER — ONDANSETRON HCL 4 MG/2ML IJ SOLN: 4.0000 mg | Freq: Three times a day (TID) | INTRAMUSCULAR | Status: DC | PRN

## 2019-10-10 MED ORDER — OXYTOCIN 40 UNITS IN NORMAL SALINE INFUSION - SIMPLE MED: INTRAVENOUS | Status: DC | PRN

## 2019-10-10 MED ORDER — NALBUPHINE HCL 10 MG/ML IJ SOLN: 5.0000 mg | Freq: Once | INTRAMUSCULAR | Status: DC | PRN

## 2019-10-10 MED ORDER — OXYCODONE HCL 5 MG PO TABS: 5.0000 mg | ORAL_TABLET | Freq: Once | ORAL | Status: DC | PRN

## 2019-10-10 MED ORDER — SODIUM CHLORIDE 0.9% FLUSH: 3.0000 mL | INTRAVENOUS | Status: DC | PRN

## 2019-10-10 MED ORDER — FENTANYL CITRATE (PF) 100 MCG/2ML IJ SOLN
INTRAMUSCULAR | Status: DC | PRN
  Administered 2019-10-10: 15 ug via INTRAVENOUS

## 2019-10-10 MED ORDER — KETOROLAC TROMETHAMINE 30 MG/ML IJ SOLN
30.0000 mg | Freq: Four times a day (QID) | INTRAMUSCULAR | Status: AC
  Administered 2019-10-10 – 2019-10-11 (×4): 30 mg via INTRAVENOUS
  Filled 2019-10-10 (×4): qty 1

## 2019-10-10 MED ORDER — ACETAMINOPHEN 500 MG PO TABS
1000.0000 mg | ORAL_TABLET | ORAL | Status: AC
  Administered 2019-10-10: 1000 mg via ORAL

## 2019-10-10 MED ORDER — HYDROMORPHONE HCL 1 MG/ML IJ SOLN: 0.2500 mg | INTRAMUSCULAR | Status: DC | PRN

## 2019-10-10 MED ORDER — KETOROLAC TROMETHAMINE 30 MG/ML IJ SOLN
INTRAMUSCULAR | Status: AC
  Filled 2019-10-10: qty 1

## 2019-10-10 MED ORDER — ONDANSETRON HCL 4 MG/2ML IJ SOLN
INTRAMUSCULAR | Status: DC | PRN
  Administered 2019-10-10: 4 mg via INTRAVENOUS

## 2019-10-10 MED ORDER — WITCH HAZEL-GLYCERIN EX PADS: 1.0000 "application " | MEDICATED_PAD | CUTANEOUS | Status: DC | PRN

## 2019-10-10 MED ORDER — SIMETHICONE 80 MG PO CHEW: 80.0000 mg | CHEWABLE_TABLET | ORAL | Status: DC | PRN

## 2019-10-10 MED ORDER — MEASLES, MUMPS & RUBELLA VAC IJ SOLR: 0.5000 mL | Freq: Once | INTRAMUSCULAR | Status: DC

## 2019-10-10 MED ORDER — IBUPROFEN 800 MG PO TABS
800.0000 mg | ORAL_TABLET | Freq: Four times a day (QID) | ORAL | Status: DC
  Administered 2019-10-11 – 2019-10-12 (×3): 800 mg via ORAL
  Filled 2019-10-10 (×5): qty 1

## 2019-10-10 MED ORDER — SODIUM CHLORIDE 0.9 % IV SOLN
2.0000 g | INTRAVENOUS | Status: AC
  Administered 2019-10-10: 2 g via INTRAVENOUS

## 2019-10-10 MED ORDER — POLYSACCHARIDE IRON COMPLEX 150 MG PO CAPS
150.0000 mg | ORAL_CAPSULE | Freq: Every day | ORAL | Status: DC
  Administered 2019-10-10 – 2019-10-12 (×3): 150 mg via ORAL
  Filled 2019-10-10 (×3): qty 1

## 2019-10-10 MED ORDER — DIPHENHYDRAMINE HCL 25 MG PO CAPS: 25.0000 mg | ORAL_CAPSULE | ORAL | Status: DC | PRN

## 2019-10-10 MED ORDER — STERILE WATER FOR IRRIGATION IR SOLN
Status: DC | PRN
  Administered 2019-10-10: 1

## 2019-10-10 MED ORDER — KETOROLAC TROMETHAMINE 30 MG/ML IJ SOLN
30.0000 mg | Freq: Once | INTRAMUSCULAR | Status: AC | PRN
  Administered 2019-10-10: 30 mg via INTRAVENOUS

## 2019-10-10 MED ORDER — MORPHINE SULFATE (PF) 0.5 MG/ML IJ SOLN
INTRAMUSCULAR | Status: DC | PRN
  Administered 2019-10-10: 150 ug via INTRATHECAL

## 2019-10-10 MED ORDER — DEXAMETHASONE SODIUM PHOSPHATE 4 MG/ML IJ SOLN
INTRAMUSCULAR | Status: DC | PRN
  Administered 2019-10-10: 4 mg via INTRAVENOUS

## 2019-10-10 MED ORDER — NALBUPHINE HCL 10 MG/ML IJ SOLN: 5.0000 mg | INTRAMUSCULAR | Status: DC | PRN

## 2019-10-10 MED ORDER — GABAPENTIN 300 MG PO CAPS
300.0000 mg | ORAL_CAPSULE | Freq: Two times a day (BID) | ORAL | Status: DC
  Administered 2019-10-10 – 2019-10-12 (×4): 300 mg via ORAL
  Filled 2019-10-10 (×2): qty 1
  Filled 2019-10-10: qty 3
  Filled 2019-10-10 (×3): qty 1
  Filled 2019-10-10 (×2): qty 3
  Filled 2019-10-10: qty 1
  Filled 2019-10-10: qty 3

## 2019-10-10 MED ORDER — PHENYLEPHRINE HCL-NACL 20-0.9 MG/250ML-% IV SOLN
INTRAVENOUS | Status: AC
  Filled 2019-10-10: qty 250

## 2019-10-10 MED ORDER — SIMETHICONE 80 MG PO CHEW
80.0000 mg | CHEWABLE_TABLET | ORAL | Status: DC
  Administered 2019-10-11 – 2019-10-12 (×2): 80 mg via ORAL
  Filled 2019-10-10 (×2): qty 1

## 2019-10-10 MED ORDER — PRENATAL MULTIVITAMIN CH
1.0000 | ORAL_TABLET | Freq: Every day | ORAL | Status: DC
  Administered 2019-10-10 – 2019-10-11 (×2): 1 via ORAL
  Filled 2019-10-10 (×3): qty 1

## 2019-10-10 MED ORDER — COCONUT OIL OIL: 1.0000 "application " | TOPICAL_OIL | Status: DC | PRN

## 2019-10-10 MED ORDER — GABAPENTIN 300 MG PO CAPS
ORAL_CAPSULE | ORAL | Status: AC
  Filled 2019-10-10: qty 1

## 2019-10-10 MED ORDER — MENTHOL 3 MG MT LOZG: 1.0000 | LOZENGE | OROMUCOSAL | Status: DC | PRN

## 2019-10-10 MED ORDER — SODIUM CHLORIDE 0.9 % IV SOLN
510.0000 mg | Freq: Once | INTRAVENOUS | Status: AC
  Administered 2019-10-11: 510 mg via INTRAVENOUS
  Filled 2019-10-10: qty 17

## 2019-10-10 MED ORDER — FENTANYL CITRATE (PF) 100 MCG/2ML IJ SOLN
INTRAMUSCULAR | Status: AC
  Filled 2019-10-10: qty 2

## 2019-10-10 MED ORDER — LACTATED RINGERS IV SOLN: INTRAVENOUS | Status: DC | PRN

## 2019-10-10 MED ORDER — GABAPENTIN 300 MG PO CAPS
300.0000 mg | ORAL_CAPSULE | ORAL | Status: AC
  Administered 2019-10-10: 300 mg via ORAL

## 2019-10-10 MED ORDER — SODIUM CHLORIDE 0.9 % IR SOLN
Status: DC | PRN
  Administered 2019-10-10: 1

## 2019-10-10 MED ORDER — SODIUM CHLORIDE 0.9 % IV SOLN: INTRAVENOUS | Status: DC | PRN

## 2019-10-10 MED ORDER — KETOROLAC TROMETHAMINE 30 MG/ML IJ SOLN: 30.0000 mg | Freq: Once | INTRAMUSCULAR | Status: DC

## 2019-10-10 MED ORDER — PROMETHAZINE HCL 25 MG/ML IJ SOLN: 6.2500 mg | INTRAMUSCULAR | Status: DC | PRN

## 2019-10-10 MED ORDER — TRAMADOL HCL 50 MG PO TABS: 50.0000 mg | ORAL_TABLET | Freq: Four times a day (QID) | ORAL | Status: DC | PRN

## 2019-10-10 MED ORDER — SODIUM CHLORIDE 0.9 % IV SOLN
INTRAVENOUS | Status: AC
  Filled 2019-10-10: qty 2

## 2019-10-10 MED ORDER — ACETAMINOPHEN 500 MG PO TABS
ORAL_TABLET | ORAL | Status: AC
  Filled 2019-10-10: qty 2

## 2019-10-10 MED ORDER — SENNOSIDES-DOCUSATE SODIUM 8.6-50 MG PO TABS
2.0000 | ORAL_TABLET | ORAL | Status: DC
  Administered 2019-10-11 – 2019-10-12 (×2): 2 via ORAL
  Filled 2019-10-10 (×2): qty 2

## 2019-10-10 MED ORDER — OXYCODONE HCL 5 MG/5ML PO SOLN: 5.0000 mg | Freq: Once | ORAL | Status: DC | PRN

## 2019-10-10 MED ORDER — LACTATED RINGERS IV SOLN: INTRAVENOUS | Status: DC

## 2019-10-10 MED ORDER — MEPERIDINE HCL 25 MG/ML IJ SOLN: 6.2500 mg | INTRAMUSCULAR | Status: DC | PRN

## 2019-10-10 MED ORDER — OXYTOCIN 40 UNITS IN NORMAL SALINE INFUSION - SIMPLE MED
INTRAVENOUS | Status: DC | PRN
  Administered 2019-10-10: 200 mL via INTRAVENOUS

## 2019-10-10 MED ORDER — SOD CITRATE-CITRIC ACID 500-334 MG/5ML PO SOLN
ORAL | Status: AC
  Filled 2019-10-10: qty 30

## 2019-10-10 MED ORDER — NALOXONE HCL 4 MG/10ML IJ SOLN
1.0000 ug/kg/h | INTRAVENOUS | Status: DC | PRN
  Filled 2019-10-10: qty 5

## 2019-10-10 MED ORDER — NALOXONE HCL 0.4 MG/ML IJ SOLN: 0.4000 mg | INTRAMUSCULAR | Status: DC | PRN

## 2019-10-10 MED ORDER — MORPHINE SULFATE (PF) 0.5 MG/ML IJ SOLN
INTRAMUSCULAR | Status: AC
  Filled 2019-10-10: qty 10

## 2019-10-10 MED ORDER — OXYCODONE-ACETAMINOPHEN 5-325 MG PO TABS: 2.0000 | ORAL_TABLET | ORAL | Status: DC | PRN

## 2019-10-10 MED ORDER — OXYTOCIN 40 UNITS IN NORMAL SALINE INFUSION - SIMPLE MED
INTRAVENOUS | Status: AC
  Filled 2019-10-10: qty 1000

## 2019-10-10 MED ORDER — DEXAMETHASONE SODIUM PHOSPHATE 4 MG/ML IJ SOLN
INTRAMUSCULAR | Status: AC
  Filled 2019-10-10: qty 1

## 2019-10-10 MED ORDER — BUPIVACAINE IN DEXTROSE 0.75-8.25 % IT SOLN
INTRATHECAL | Status: DC | PRN
  Administered 2019-10-10: 1.6 mL via INTRATHECAL

## 2019-10-10 MED ORDER — SCOPOLAMINE 1 MG/3DAYS TD PT72
MEDICATED_PATCH | TRANSDERMAL | Status: AC
  Filled 2019-10-10: qty 1

## 2019-10-10 MED ORDER — MAGNESIUM HYDROXIDE 400 MG/5ML PO SUSP: 30.0000 mL | ORAL | Status: DC | PRN

## 2019-10-10 MED ORDER — TETANUS-DIPHTH-ACELL PERTUSSIS 5-2.5-18.5 LF-MCG/0.5 IM SUSP: 0.5000 mL | Freq: Once | INTRAMUSCULAR | Status: DC

## 2019-10-10 MED ORDER — TRANEXAMIC ACID-NACL 1000-0.7 MG/100ML-% IV SOLN
INTRAVENOUS | Status: AC
  Filled 2019-10-10: qty 100

## 2019-10-10 MED ORDER — ZOLPIDEM TARTRATE 5 MG PO TABS: 5.0000 mg | ORAL_TABLET | Freq: Every evening | ORAL | Status: DC | PRN

## 2019-10-10 MED ORDER — OXYCODONE HCL 5 MG PO TABS
5.0000 mg | ORAL_TABLET | ORAL | Status: DC | PRN
  Filled 2019-10-10: qty 1

## 2019-10-10 MED ORDER — OXYTOCIN 40 UNITS IN NORMAL SALINE INFUSION - SIMPLE MED: 2.5000 [IU]/h | INTRAVENOUS | Status: AC

## 2019-10-10 MED ORDER — PHENYLEPHRINE HCL-NACL 20-0.9 MG/250ML-% IV SOLN
INTRAVENOUS | Status: DC | PRN
  Administered 2019-10-10: 60 ug/min via INTRAVENOUS

## 2019-10-10 MED ORDER — DIPHENHYDRAMINE HCL 50 MG/ML IJ SOLN: 12.5000 mg | INTRAMUSCULAR | Status: DC | PRN

## 2019-10-10 SURGICAL SUPPLY — 35 items
BENZOIN TINCTURE PRP APPL 2/3 (GAUZE/BANDAGES/DRESSINGS) ×3 IMPLANT
CHLORAPREP W/TINT 26ML (MISCELLANEOUS) ×3 IMPLANT
CLAMP CORD UMBIL (MISCELLANEOUS) IMPLANT
CLOSURE STERI STRIP 1/2 X4 (GAUZE/BANDAGES/DRESSINGS) ×3 IMPLANT
CLOTH BEACON ORANGE TIMEOUT ST (SAFETY) ×3 IMPLANT
DRSG OPSITE POSTOP 4X10 (GAUZE/BANDAGES/DRESSINGS) ×3 IMPLANT
ELECT REM PT RETURN 9FT ADLT (ELECTROSURGICAL) ×3
ELECTRODE REM PT RTRN 9FT ADLT (ELECTROSURGICAL) ×1 IMPLANT
EXTRACTOR VACUUM M CUP 4 TUBE (SUCTIONS) IMPLANT
EXTRACTOR VACUUM M CUP 4' TUBE (SUCTIONS)
GLOVE BIOGEL PI IND STRL 7.0 (GLOVE) ×3 IMPLANT
GLOVE BIOGEL PI INDICATOR 7.0 (GLOVE) ×6
GLOVE ECLIPSE 7.0 STRL STRAW (GLOVE) ×3 IMPLANT
GOWN STRL REUS W/TWL LRG LVL3 (GOWN DISPOSABLE) ×6 IMPLANT
KIT ABG SYR 3ML LUER SLIP (SYRINGE) IMPLANT
NEEDLE HYPO 22GX1.5 SAFETY (NEEDLE) ×3 IMPLANT
NEEDLE HYPO 25X5/8 SAFETYGLIDE (NEEDLE) ×3 IMPLANT
NS IRRIG 1000ML POUR BTL (IV SOLUTION) ×3 IMPLANT
PACK C SECTION WH (CUSTOM PROCEDURE TRAY) ×3 IMPLANT
PAD ABD 7.5X8 STRL (GAUZE/BANDAGES/DRESSINGS) ×3 IMPLANT
PAD OB MATERNITY 4.3X12.25 (PERSONAL CARE ITEMS) ×3 IMPLANT
PENCIL SMOKE EVAC W/HOLSTER (ELECTROSURGICAL) ×3 IMPLANT
RTRCTR C-SECT PINK 25CM LRG (MISCELLANEOUS) IMPLANT
SPONGE GAUZE 4X4 12PLY STER LF (GAUZE/BANDAGES/DRESSINGS) ×3 IMPLANT
SUT PDS AB 0 CTX 36 PDP370T (SUTURE) IMPLANT
SUT PLAIN 2 0 XLH (SUTURE) ×3 IMPLANT
SUT VIC AB 0 CTX 36 (SUTURE) ×6
SUT VIC AB 0 CTX36XBRD ANBCTRL (SUTURE) ×2 IMPLANT
SUT VIC AB 2-0 CT1 27 (SUTURE) ×6
SUT VIC AB 2-0 CT1 TAPERPNT 27 (SUTURE) ×2 IMPLANT
SUT VIC AB 4-0 KS 27 (SUTURE) ×3 IMPLANT
SYR CONTROL 10ML LL (SYRINGE) ×3 IMPLANT
TOWEL OR 17X24 6PK STRL BLUE (TOWEL DISPOSABLE) ×3 IMPLANT
TRAY FOLEY W/BAG SLVR 14FR LF (SET/KITS/TRAYS/PACK) ×3 IMPLANT
WATER STERILE IRR 1000ML POUR (IV SOLUTION) ×3 IMPLANT
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
## 2019-10-10 NOTE — Transfer of Care (Signed)
Immediate Anesthesia Transfer of Care Note  Patient: Krista Combs  Procedure(s) Performed: CESAREAN SECTION WITH BILATERAL TUBAL LIGATION (N/A Abdomen)  Patient Location: PACU  Anesthesia Type:Spinal  Level of Consciousness: awake, alert  and oriented  Airway & Oxygen Therapy: Patient Spontanous Breathing  Post-op Assessment: Report given to RN and Post -op Vital signs reviewed and stable  Post vital signs: Reviewed and stable  Last Vitals:  Vitals Value Taken Time  BP 121/64 10/10/19 1145  Temp    Pulse 67 10/10/19 1147  Resp 22 10/10/19 1147  SpO2 99 % 10/10/19 1147  Vitals shown include unvalidated device data.  Last Pain:  Vitals:   10/10/19 0828  TempSrc: Oral         Complications: No apparent anesthesia complications

## 2019-10-10 NOTE — Anesthesia Preprocedure Evaluation (Signed)
Anesthesia Evaluation  Patient identified by MRN, date of birth, ID band Patient awake    Reviewed: Allergy & Precautions, H&P , NPO status , Patient's Chart, lab work & pertinent test results  Airway Mallampati: II  TM Distance: >3 FB Neck ROM: full    Dental no notable dental hx.    Pulmonary neg pulmonary ROS, former smoker,    Pulmonary exam normal breath sounds clear to auscultation       Cardiovascular negative cardio ROS Normal cardiovascular exam Rhythm:regular Rate:Normal     Neuro/Psych Seizures -, Well Controlled,  PSYCHIATRIC DISORDERS Anxiety Depression    GI/Hepatic   Endo/Other    Renal/GU      Musculoskeletal   Abdominal (+) + obese,   Peds  Hematology  (+) anemia ,   Anesthesia Other Findings   Reproductive/Obstetrics (+) Pregnancy                             Anesthesia Physical  Anesthesia Plan  ASA: II  Anesthesia Plan: Spinal   Post-op Pain Management:    Induction: Intravenous  PONV Risk Score and Plan: 2 and Treatment may vary due to age or medical condition  Airway Management Planned: Natural Airway  Additional Equipment:   Intra-op Plan:   Post-operative Plan:   Informed Consent: I have reviewed the patients History and Physical, chart, labs and discussed the procedure including the risks, benefits and alternatives for the proposed anesthesia with the patient or authorized representative who has indicated his/her understanding and acceptance.       Plan Discussed with: Anesthesiologist, Surgeon and CRNA  Anesthesia Plan Comments:         Anesthesia Quick Evaluation

## 2019-10-10 NOTE — Lactation Note (Signed)
This note was copied from a baby's chart. Lactation Consultation Note  Patient Name: Krista Combs [id number] Date: 10/10/2019 Reason for consult: Initial assessment  Mom has 6 months BF exp. With her almost 22 yr old son.  She has been leaking colostrum since 20wk.  + breast changes.   She is knowledgeable of how to hand express.  Demonstrated for LC and LC helped to hand express 1 ml into spoon which was given to infant during oral assessment.  Infant cueing in bassinet.   Anterior lingual frenulum visible.  Strong suck felt with gloved finger.  Mom attempted to latch in cradle.  Mom leaned down to place nipple into mouth.  LC suggested cross cradle for more head support.  Infant latched deeper with cheeks flush to breast.  Infant began rhythmically sucking with a couple of swallows heard.  LC encouraged mom to use massage and compression during the bf and to offer both sides with each feeding.  Mom states she fed her previous child primarily on one breast. Infant fed 6 minutes then came off, re rooting and fussy.  Moms nipple appeared rounded.  LC assisted mom in sandwiching her breast in order for infant to obtain a deeper latch.  Infant latched easily and began sucking.  LC left room and infant was still feeding.  LC provided lactation brochure, phone numbers and support group information for family.    BF basics reviewed with mom and dad.  8-12 + feeds in 24 hours, feeding cues, STS, and hand expression.    All questions answered and family was encouraged to call out if concerns or questions arise.  Maternal Data Has patient been taught Hand Expression?: Yes Does the patient have breastfeeding experience prior to this delivery?: Yes  Feeding Feeding Type: Breast Fed  LATCH Score Latch: Repeated attempts needed to sustain latch, nipple held in mouth throughout feeding, stimulation needed to elicit sucking reflex.  Audible Swallowing: A few with stimulation  Type of  Nipple: Everted at rest and after stimulation  Comfort (Breast/Nipple): Soft / non-tender  Hold (Positioning): Assistance needed to correctly position infant at breast and maintain latch.  LATCH Score: 7  Interventions Interventions: Breast feeding basics reviewed;Assisted with latch;Skin to skin;Breast massage;Hand express;Position options;Support pillows;Adjust position;Breast compression  Lactation Tools Discussed/Used     Consult Status Consult Status: Follow-up Date: 10/11/19 Follow-up type: In-patient    Maryruth Hancock [facility] 10/10/2019, 5:57 PM

## 2019-10-10 NOTE — Discharge Summary (Signed)
Postpartum Discharge Summary     Patient Name: Krista Combs DOB: 1995/11/08 MRN: [id number]  Date of admission: 10/10/2019 Delivery date:10/10/2019  Delivering provider: Verita Schneiders A  Date of discharge: 10/12/2019  Admitting diagnosis: Previous cesarean section [Z98.891] Intrauterine pregnancy: [redacted]w[redacted]d    Secondary diagnosis:  Principal Problem:   S/P tubal ligation/bilateral salpingectomy Active Problems:   Postpartum care following cesarean delivery   Supervision of other normal pregnancy, antepartum   Rh negative state in antepartum period   Anemia during pregnancy in third trimester   Previous cesarean section  Additional problems: None    Discharge diagnosis: Term Pregnancy Delivered                                              Post partum procedures:Salpingectomy done intra-operatively Augmentation: N/A Complications: None  Hospital course: Sceduled C/S   24y.o. yo [id number] admitted to the hospital 10/10/2019 for scheduled cesarean section with the following indication:Elective Repeat.Delivery details are as follows:  Membrane Rupture Time/Date: 10:58 AM ,10/10/2019   Delivery Method:C-Section, Low Transverse  Details of operation can be found in separate operative note.  Patient had an uncomplicated postpartum course. She received rhogam prior to discharge. Her hgb on admission was 9.9 and dropped to 7.8 post-op, for which she was given IV iron. She is ambulating, tolerating a regular diet, passing flatus, and urinating well. Patient is discharged home in stable condition on  10/12/19        Newborn Data: Birth date:10/10/2019  Birth time:10:59 AM  Gender:Female  Living status:Living  Apgars:9 ,10  Weight:3390 g     Magnesium Sulfate received: No BMZ received: No Rhophylac:Yes MMR:No T-DaP:Given prenatally Flu: No Transfusion:No  Physical exam  Vitals:   10/11/19 1419 10/11/19 2119 10/11/19 2200 10/12/19 0520  BP: 120/75 123/65 112/66  121/69  Pulse: 88 80 80 89  Resp: _0 Temp: 98 F (36.7 C) 98.2 F (36.8 C) 97.9 F (36.6 C) 97.8 F (36.6 C)  TempSrc: Oral Oral Oral Oral  SpO2: 99% 98% 98% 98%  Weight:      Height:       General: alert, cooperative and no distress Lochia: appropriate Uterine Fundus: firm Incision: Healing well with no significant drainage, No significant erythema, Dressing is clean, dry, and intact DVT Evaluation: No evidence of DVT seen on physical exam. Mild LE edema bilaterally Labs: Lab Results  Component Value Date   WBC 11.7 (H) 10/11/2019   HGB 7.8 (L) 10/11/2019   HCT 25.1 (L) 10/11/2019   MCV 80.2 10/11/2019   PLT 158 10/11/2019   CMP Latest Ref Rng & Units 10/11/2019  Glucose 70 - 99 mg/dL -  BUN 6 - 20 mg/dL -  Creatinine 0.44 - 1.00 mg/dL 0.58  Sodium 135 - 145 mmol/L -  Potassium 3.5 - 5.1 mmol/L -  Chloride 98 - 111 mmol/L -  CO2 22 - 32 mmol/L -  Calcium 8.9 - 10.3 mg/dL -  Total Protein 6.5 - 8.1 g/dL -  Total Bilirubin 0.3 - 1.2 mg/dL -  Alkaline Phos 38 - 126 U/L -  AST 15 - 41 U/L -  ALT 0 - 44 U/L -   Edinburgh Score: Edinburgh Postnatal Depression Scale Screening Tool 10/10/2019  I have been able to laugh and see the funny side of things. (No Data)  I have looked forward with enjoyment to things. -  I have blamed myself unnecessarily when things went wrong. -  I have been anxious or worried for no good reason. -  I have felt scared or panicky for no good reason. -  Things have been getting on top of me. -  I have been so unhappy that I have had difficulty sleeping. -  I have felt sad or miserable. -  I have been so unhappy that I have been crying. -  The thought of harming myself has occurred to me. Flavia Shipper Postnatal Depression Scale Total -     After visit meds:  Allergies as of 10/12/2019      Reactions   Pistachio Nut (diagnostic) Anaphylaxis   Minocycline Hives   Cinnamon Nausea And Vomiting      Medication List    TAKE these  medications   acetaminophen 325 MG tablet Commonly known as: TYLENOL Take 325 mg by mouth every 6 (six) hours as needed for mild pain or headache.   Blood Pressure Kit Devi 1 Device by Does not apply route once a week. To be monitored weekly from home   ibuprofen 800 MG tablet Commonly known as: ADVIL Take 1 tablet (800 mg total) by mouth every 6 (six) hours.   iron polysaccharides 150 MG capsule Commonly known as: NIFEREX Take 1 capsule (150 mg total) by mouth daily.   oxyCODONE 5 MG immediate release tablet Commonly known as: Oxy IR/ROXICODONE Take 1-2 tablets (5-10 mg total) by mouth every 4 (four) hours as needed for moderate pain.   polyethylene glycol powder 17 GM/SCOOP powder Commonly known as: GLYCOLAX/MIRALAX Take 17 g by mouth daily as needed.   prenatal vitamin w/FE, FA 27-1 MG Tabs tablet Take 1 tablet by mouth daily at 12 noon.        Discharge home in stable condition Infant Feeding: Breast Infant Disposition:home with mother Discharge instruction: per After Visit Summary and Postpartum booklet. Activity: Advance as tolerated. Pelvic rest for 6 weeks.  Diet: routine diet Future Appointments: Future Appointments  Date Time Provider [facility]  10/24/2019  1:15 PM [facility] NURSE [facility]  11/06/2019  2:15 PM Caren Macadam, MD [facility]   Follow up Visit:   Please schedule this patient for a In person postpartum visit in 4 weeks with the following provider: Any provider. Additional Postpartum F/U:Incision check 1 week  Low risk pregnancy complicated by: none Delivery mode:  C-Section, Low Transverse  Anticipated Birth Control:  salpingectomy   10/12/2019 Clarnce Flock, MD

## 2019-10-10 NOTE — Anesthesia Procedure Notes (Signed)
Spinal  Patient location during procedure: OR Start time: 10/10/2019 10:30 AM End time: 10/10/2019 10:34 AM Preanesthetic Checklist Completed: patient identified, IV checked, risks and benefits discussed, surgical consent, monitors and equipment checked, pre-op evaluation and timeout performed Spinal Block Patient position: sitting Prep: DuraPrep and site prepped and draped Patient monitoring: blood pressure and continuous pulse ox Approach: midline Location: L4-5 Injection technique: single-shot Needle Needle type: Pencan  Needle gauge: 24 G Needle length: 10 cm Assessment Sensory level: T4 Additional Notes FF CSF. Clear aspirate before and after injection of 1.44mL Bupi 0.75% + Fentanyl and Duramorph .

## 2019-10-10 NOTE — Op Note (Signed)
Krista Combs PROCEDURE DATE: 10/10/2019  PREOPERATIVE DIAGNOSES: Intrauterine pregnancy at [redacted]w[redacted]d weeks gestation; previous cesarean section; declines trial of labor; undesired fertility  POSTOPERATIVE DIAGNOSES: The same  PROCEDURE: Repeat Low Transverse Cesarean Section, Bilateral Salpingectomy  SURGEON:  Dr. Verita Schneiders  ASSISTANT:  Dr. Barrington Ellison  ANESTHESIOLOGY TEAM: Anesthesiologist: Lynda Rainwater, MD  CRNA: Rhymer, Waynard Reeds, CRNA  INDICATIONS: Krista Combs is a 24 y.o. [phone] at [redacted]w[redacted]d here for cesarean section and bilateral tubal sterilization secondary to the indications listed under preoperative diagnoses; please see preoperative note for further details.  The risks of surgery were discussed with the patient including but were not limited to: bleeding which may require transfusion or reoperation; infection which may require antibiotics; injury to bowel, bladder, ureters or other surrounding organs; injury to the fetus; need for additional procedures including hysterectomy in the event of a life-threatening hemorrhage; formation of adhesions; placental abnormalities wth subsequent pregnancies; incisional problems; thromboembolic phenomenon and other postoperative/anesthesia complications.  Patient also desires permanent sterilization.  Other reversible forms of contraception were discussed with patient; she declines all other modalities.   Risks of sterilization procedure discussed with patient including but not limited to: risk of regret, permanence of method, bleeding, infection, injury to surrounding organs and need for additional procedures.  Failure risk of about 1% with increased risk of ectopic gestation if pregnancy occurs was also discussed with patient.  Also discussed possibility of post-tubal pain syndrome. The patient concurred with the proposed plan, giving informed written consent for the procedures.  FINDINGS:  Viable female infant in cephalic  presentation.  Apgars 9 and 9.  Clear amniotic fluid.  Intact placenta, three vessel cord.  Normal uterus, fallopian tubes and ovaries bilaterally. Fallopian tubes were sterilized bilaterally. Minimal intraperitoneal adhesive disease.  ANESTHESIA: Spinal ESTIMATED BLOOD LOSS: 311 ml SPECIMENS: Placenta sent to L&D and bilateral fallopian tube fragments also sent to pathology COMPLICATIONS: None immediate  PROCEDURE IN DETAIL:  The patient preoperatively received intravenous antibiotics and had sequential compression devices applied to her lower extremities.   She was then taken to the operating room where spinal anesthesia was administered and was found to be adequate. She was then placed in a dorsal supine position with a leftward tilt, and prepped and draped in a sterile manner.  A foley catheter was placed into her bladder and attached to constant gravity.  After an adequate timeout was performed, a Pfannenstiel skin incision was made with scalpel over her preexisting scar and carried through to the underlying layer of fascia. The fascia was incised in the midline, and this incision was extended bilaterally using the Mayo scissors.  Kocher clamps were applied to the superior aspect of the fascial incision and the underlying rectus muscles were dissected off bluntly.  A similar process was carried out on the inferior aspect of the fascial incision. The rectus muscles were separated in the midline and the peritoneum was entered bluntly. The Alexis self-retaining retractor was introduced into the abdominal cavity.  Attention was turned to the lower uterine segment where a low transverse hysterotomy was made with a scalpel and extended bilaterally bluntly.  The infant was successfully delivered, the cord was clamped and cut after one minute, and the infant was handed over to the awaiting neonatology team. Uterine massage was then administered, and the placenta delivered intact with a three-vessel cord. The  uterus was then cleared of clots and debris.  The hysterotomy was closed with 0 Vicryl in a running locked fashion, and an imbricating  layer was also placed with 0 Vicryl.  Figure-of-eight 0 Vicryl serosal stitches were placed to help with hemostasis.  Attention was then turned to the left fallopian tube. Kelly forceps were placed on the mesosalpinx underneath most of the tube.  This pedicle was double suture ligated with 2-0 Vicryl, and the tube including the fimbriated end was excised.  The right fallopian tube was then identified, doubly ligated, and was excised in a similar fashion allowing for bilateral tubal sterilization via bilateral salpingectomy.  Good hemostasis was noted overall. The pelvis was cleared of all clot and debris. Hemostasis was confirmed on all surfaces.  The retractor was removed.  The peritoneum was closed with a 0 Vicryl running stitch and the rectus muscles were reapproximated using 0 Vicryl interrupted stitches. The fascia was then closed using 0 PDS in a running fashion.  The subcutaneous layer was irrigated, reapproximated with 2-0 plain gut interrupted stitches, and the skin was closed with a 4-0 Vicryl subcuticular stitch. The patient tolerated the procedure well. Sponge, instrument and needle counts were correct x 3.  She was taken to the recovery room in stable condition.    Krista Collins, MD, FACOG Obstetrician & Gynecologist, [facility] for [employer], [facility]

## 2019-10-10 NOTE — Anesthesia Postprocedure Evaluation (Signed)
Anesthesia Post Note  Patient: Krista Combs  Procedure(s) Performed: CESAREAN SECTION WITH BILATERAL TUBAL LIGATION (N/A Abdomen)     Patient location during evaluation: PACU Anesthesia Type: Spinal Level of consciousness: awake and alert Pain management: pain level controlled Vital Signs Assessment: post-procedure vital signs reviewed and stable Respiratory status: spontaneous breathing, nonlabored ventilation and respiratory function stable Cardiovascular status: blood pressure returned to baseline and stable Postop Assessment: no apparent nausea or vomiting Anesthetic complications: no    Last Vitals:  Vitals:   10/10/19 1245 10/10/19 1252  BP: 119/80 (!) 115/39  Pulse: 72 72  Resp: 18 18  Temp: 36.6 C 36.7 C  SpO2: 98% 99%    Last Pain:  Vitals:   10/10/19 1245  TempSrc: Oral  PainSc: 2    Pain Goal:    LLE Motor Response: Purposeful movement (10/10/19 1230)   RLE Motor Response: Purposeful movement (10/10/19 1230)       Epidural/Spinal Function Cutaneous sensation: Able to Discern Pressure (10/10/19 1245), Patient able to flex knees: Yes (10/10/19 1245), Patient able to lift hips off bed: No (10/10/19 1245), Back pain beyond tenderness at insertion site: No (10/10/19 1245), Progressively worsening motor and/or sensory loss: No (10/10/19 1245), Bowel and/or bladder incontinence post epidural: No (10/10/19 1245)  Lowella Curb

## 2019-10-11 LAB — CBC
HCT: 27.3 % — ABNORMAL LOW (ref 36.0–46.0)
HCT: 25.1 % — ABNORMAL LOW (ref 36.0–46.0)
Hemoglobin: 8.4 g/dL — ABNORMAL LOW (ref 12.0–15.0)
Hemoglobin: 7.8 g/dL — ABNORMAL LOW (ref 12.0–15.0)
MCH: 24.8 pg — ABNORMAL LOW (ref 26.0–34.0)
MCH: 24.9 pg — ABNORMAL LOW (ref 26.0–34.0)
MCHC: 30.8 g/dL (ref 30.0–36.0)
MCHC: 31.1 g/dL (ref 30.0–36.0)
MCV: 80.5 fL (ref 80.0–100.0)
MCV: 80.2 fL (ref 80.0–100.0)
Platelets: 199 10*3/uL (ref 150–400)
Platelets: 158 10*3/uL (ref 150–400)
RBC: 3.39 MIL/uL — ABNORMAL LOW (ref 3.87–5.11)
RBC: 3.13 MIL/uL — ABNORMAL LOW (ref 3.87–5.11)
RDW: 15.1 % (ref 11.5–15.5)
RDW: 15.1 % (ref 11.5–15.5)
WBC: 12.6 10*3/uL — ABNORMAL HIGH (ref 4.0–10.5)
WBC: 11.7 10*3/uL — ABNORMAL HIGH (ref 4.0–10.5)
nRBC: 0 % (ref 0.0–0.2)
nRBC: 0 % (ref 0.0–0.2)

## 2019-10-11 LAB — CREATININE, SERUM
Creatinine, Ser: 0.58 mg/dL (ref 0.44–1.00)
GFR calc Af Amer: >60 mL/min (ref >60)
GFR calc non Af Amer: >60 mL/min (ref >60)

## 2019-10-11 LAB — SURGICAL PATHOLOGY

## 2019-10-11 LAB — BIRTH TISSUE RECOVERY COLLECTION (PLACENTA DONATION)

## 2019-10-11 MED ORDER — RHO D IMMUNE GLOBULIN 1500 UNIT/2ML IJ SOSY
300.0000 ug | PREFILLED_SYRINGE | Freq: Once | INTRAMUSCULAR | Status: AC
  Administered 2019-10-11: 300 ug via INTRAVENOUS
  Filled 2019-10-11: qty 2

## 2019-10-11 NOTE — Lactation Note (Addendum)
This note was copied from a baby's chart. Lactation Consultation Note  Patient Name: Girl Coral Timme [id number] Date: 10/11/2019  Baby girl Arabella now 54 hours old. Mom reports she recently tried to feed her.  Fed about 5 minutes and fell asleep.  Infant not cuing.  Tech came in to do hearing screen.  Arabella did not stir during hearing screen.   mom denies breast or nipple soreness or tenderness. Inquired about weight loss in first baby with csection.  Mom reports her first baby had a large weight loss in hospital and she had to supplement with formula.  Infant with 6 percent weight loss but has had 6 voids and 5 poops thus far.  Discussed initiating pumping with DEBP past breastfeedings(post pumping) and massage/hand expression and for mom to feed back all expressed mothers milk that she gets via spoon. Mom in agreement. Mom has first years DEBP at home says it works but not well.  Mom not on [facility].  Says she may apply.  Has Medicaid.  Urged to feed on cue and 8-12 or more times day.  Urged to hand express and pump past the 2-3 hour breastfeeding. Stayed duration of pumping with mom.  Mom reports comfort.  No nipple trauma past pumping. Discussed cluster feeding. Urged mom to call lactation as needed.   Maternal Data    Feeding Feeding Type: Breast Fed  LATCH Score Latch: Repeated attempts needed to sustain latch, nipple held in mouth throughout feeding, stimulation needed to elicit sucking reflex.  Audible Swallowing: None(sleepy!!)  Type of Nipple: Everted at rest and after stimulation  Comfort (Breast/Nipple): Soft / non-tender  Hold (Positioning): No assistance needed to correctly position infant at breast.  LATCH Score: 7  Interventions    Lactation Tools Discussed/Used     Consult Status      Niyah Mamaril Michaelle Copas 10/11/2019, 12:12 PM

## 2019-10-11 NOTE — Progress Notes (Addendum)
POSTPARTUM PROGRESS NOTE  Post Partum Day 1 Subjective:  Krista Combs is a 24 y.o. [id number] [redacted]w[redacted]d s/p rPTCS +BTL.  No acute events overnight.  Pt denies problems with ambulating, voiding or po intake.  She denies nausea or vomiting.  Pain is well controlled.  She has had flatus. She has not had bowel movement.  Lochia Small.   Objective: Blood pressure 106/63, pulse 64, temperature 97.9 F (36.6 C), temperature source Oral, resp. rate 16, height 5\' 2"  (1.575 m), weight 93.4 kg, last menstrual period 11/27/2018, SpO2 99 %, currently breastfeeding.  Physical Exam:  General: alert, cooperative and no distress Lochia:normal flow Chest: CTAB Heart: RRR no m/r/g Abdomen: +BS, soft, nontender,  Uterine Fundus: firm DVT Evaluation: No calf swelling or tenderness Extremities: trace edema  Recent Labs    10/09/19 0845 10/11/19 0611  HGB 9.9* 8.4*  HCT 31.7* 27.3*    Assessment/Plan:  ASSESSMENT: Krista Combs is a 25 y.o. 21 [redacted]w[redacted]d s/p rLTCS+BTL  #Anemia, blood loss: Hgb 9.9>8.4. softer BP this am. No dizziness/lightheadedness.  Plan for IV iron this am and repeat CBC at 10am. Monitor vitals.  #IVF: continue NS for now. #breast feeding #possible DC home tomorrow.   LOS: 1 day   [redacted]w[redacted]d, MD 10/11/2019, 9:29 AM   GME ATTESTATION:  I saw and evaluated the patient. I agree with the findings and the plan of care as documented in the resident's note.  10/13/2019, DO OB Fellow, [facility] Center for [facility] 10/11/2019 8:45 PM
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
## 2019-10-12 MED ORDER — OXYCODONE HCL 5 MG PO TABS: 5.0000 mg | ORAL_TABLET | ORAL | 0 refills | Status: DC | PRN

## 2019-10-12 MED ORDER — POLYETHYLENE GLYCOL 3350 17 GM/SCOOP PO POWD: 17.0000 g | Freq: Every day | ORAL | 1 refills | Status: AC | PRN

## 2019-10-12 MED ORDER — IBUPROFEN 800 MG PO TABS: 800.0000 mg | ORAL_TABLET | Freq: Four times a day (QID) | ORAL | 0 refills | Status: DC

## 2019-10-12 NOTE — Lactation Note (Signed)
This note was copied from a baby's chart. Lactation Consultation Note  Patient Name: Krista Combs [id number] Date: 10/12/2019    Infant is being discharged home. I reminded Mom to take home her DEBP parts (Mom is applying for [facility]).   Infant was crying & Mom inquired about infant's tongue-tie stating that it seemed worse for this infant than her older child's. With infant crying, one could see an anterior frenulum that seemed to reduce elevation.   I assisted Mom in a side-lying position to latch infant. Although fussy, infant soon latched & frequent swallows were soon heard with the naked ear. Mom was comfortable with latch. I clarified with Mom that if infant is doing well with breastfeeding (softening the breast, gaining weight, etc), then there may be no need for further intervention.   Mom's milk is coming to volume.   Lurline Hare [facility] 10/12/2019, 1:57 PM

## 2019-10-12 NOTE — Lactation Note (Signed)
This note was copied from a baby's chart. Lactation Consultation Note  Patient Name: Krista Combs [id number] Date: 10/12/2019   Infant is 23 hrs old & is 9.6% below BW. Infant has had 10 voids & 9 stools since birth (infant has only lost 110 g since yesterday & had 5 voids & 5 stools during the time period between getting weighed).    Mom reports that her breasts are "getting heavier & more full." She says she hears swallows when infant is at breast & she also notices that her breast is softer after infant feeds.  Mom is pumping & also offering EBM via spoon. Mom has no questions for me at this time.    Lurline Hare [facility] 10/12/2019, 12:48 PM

## 2019-10-12 NOTE — Progress Notes (Signed)
POSTPARTUM PROGRESS NOTE  POD #2  Subjective:  Krista Combs is a 24 y.o. [id number] s/p rLTCS and bilateral salpingectomy at [redacted]w[redacted]d.  She reports she doing well. No acute events overnight. She denies any problems with ambulating, voiding or po intake. Denies nausea or vomiting. She has passed flatus. Pain is well controlled.  Lochia is like less than a period.  Objective: Blood pressure 121/69, pulse 89, temperature 97.8 F (36.6 C), temperature source Oral, resp. rate 18, height 5\' 2"  (1.575 m), weight 93.4 kg, last menstrual period 11/27/2018, SpO2 98 %, currently breastfeeding.  Physical Exam:  General: alert, cooperative and no distress Chest: no respiratory distress Heart:regular rate, distal pulses intact Abdomen: soft, nontender,  Uterine Fundus: firm, appropriately tender DVT Evaluation: No calf swelling or tenderness Extremities: mild LE edema Skin: warm, dry; incision clean/dry/intact w/ honeycomb dressing in place  Recent Labs    10/11/19 0611 10/11/19 1257  HGB 8.4* 7.8*  HCT 27.3* 25.1*    Assessment/Plan: Krista Combs is a 24 y.o. 21 s/p rLTCS and bilateral salpingectomy at [redacted]w[redacted]d.  POD#2 - Doing welll; pain well controlled. H/H appropriate  Routine postpartum care  OOB, ambulated  Lovenox for VTE prophylaxis Anemia: asymptomatic, s/p IV iron Contraception: s/p bilateral salpingectomy Feeding: breast SW: s/p consult, no barriers to discharge  Dispo: Plan for discharge POD#2-3, likely day #3 due to excessive infant weight loss today.   LOS: 2 days   [redacted]w[redacted]d, MD/[facility] Fellow  10/12/2019, 11:36 AM

## 2019-10-12 NOTE — Discharge Instructions (Signed)
Cesarean Delivery, Care After This sheet gives you information about how to care for yourself after your procedure. Your health care provider may also give you more specific instructions. If you have problems or questions, contact your health care provider. What can I expect after the procedure? After the procedure, it is common to have:  A small amount of blood or clear fluid coming from the incision.  Some redness, swelling, and pain in your incision area.  Some abdominal pain and soreness.  Vaginal bleeding (lochia). Even though you did not have a vaginal delivery, you will still have vaginal bleeding and discharge.  Pelvic cramps.  Fatigue. You may have pain, swelling, and discomfort in the tissue between your vagina and your anus (perineum) if:  Your C-section was unplanned, and you were allowed to labor and push.  An incision was made in the area (episiotomy) or the tissue tore during attempted vaginal delivery. Follow these instructions at home: Incision care   Follow instructions from your health care provider about how to take care of your incision. Make sure you: ? Wash your hands with soap and water before you change your bandage (dressing). If soap and water are not available, use hand sanitizer. ? If you have a dressing, change it or remove it as told by your health care provider. ? Leave stitches (sutures), skin staples, skin glue, or adhesive strips in place. These skin closures may need to stay in place for 2 weeks or longer. If adhesive strip edges start to loosen and curl up, you may trim the loose edges. Do not remove adhesive strips completely unless your health care provider tells you to do that.  Check your incision area every day for signs of infection. Check for: ? More redness, swelling, or pain. ? More fluid or blood. ? Warmth. ? Pus or a bad smell.  Do not take baths, swim, or use a hot tub until your health care provider says it's okay. Ask your health  care provider if you can take showers.  When you cough or sneeze, hug a pillow. This helps with pain and decreases the chance of your incision opening up (dehiscing). Do this until your incision heals. Medicines  Take over-the-counter and prescription medicines only as told by your health care provider.  If you were prescribed an antibiotic medicine, take it as told by your health care provider. Do not stop taking the antibiotic even if you start to feel better.  Do not drive or use heavy machinery while taking prescription pain medicine. Lifestyle  Do not drink alcohol. This is especially important if you are breastfeeding or taking pain medicine.  Do not use any products that contain nicotine or tobacco, such as cigarettes, e-cigarettes, and chewing tobacco. If you need help quitting, ask your health care provider. Eating and drinking  Drink at least 8 eight-ounce glasses of water every day unless told not to by your health care provider. If you breastfeed, you may need to drink even more water.  Eat high-fiber foods every day. These foods may help prevent or relieve constipation. High-fiber foods include: ? Whole grain cereals and breads. ? Brown rice. ? Beans. ? Fresh fruits and vegetables. Activity   If possible, have someone help you care for your baby and help with household activities for at least a few days after you leave the hospital.  Return to your normal activities as told by your health care provider. Ask your health care provider what activities are safe for   you.  Rest as much as possible. Try to rest or take a nap while your baby is sleeping.  Do not lift anything that is heavier than 10 lbs (4.5 kg), or the limit that you were told, until your health care provider says that it is safe.  Talk with your health care provider about when you can engage in sexual activity. This may depend on your: ? Risk of infection. ? How fast you heal. ? Comfort and desire to  engage in sexual activity. General instructions  Do not use tampons or douches until your health care provider approves.  Wear loose, comfortable clothing and a supportive and well-fitting bra.  Keep your perineum clean and dry. Wipe from front to back when you use the toilet.  If you pass a blood clot, save it and call your health care provider to discuss. Do not flush blood clots down the toilet before you get instructions from your health care provider.  Keep all follow-up visits for you and your baby as told by your health care provider. This is important. Contact a health care provider if:  You have: ? A fever. ? Bad-smelling vaginal discharge. ? Pus or a bad smell coming from your incision. ? Difficulty or pain when urinating. ? A sudden increase or decrease in the frequency of your bowel movements. ? More redness, swelling, or pain around your incision. ? More fluid or blood coming from your incision. ? A rash. ? Nausea. ? Little or no interest in activities you used to enjoy. ? Questions about caring for yourself or your baby.  Your incision feels warm to the touch.  Your breasts turn red or become painful or hard.  You feel unusually sad or worried.  You vomit.  You pass a blood clot from your vagina.  You urinate more than usual.  You are dizzy or light-headed. Get help right away if:  You have: ? Pain that does not go away or get better with medicine. ? Chest pain. ? Difficulty breathing. ? Blurred vision or spots in your vision. ? Thoughts about hurting yourself or your baby. ? New pain in your abdomen or in one of your legs. ? A severe headache.  You faint.  You bleed from your vagina so much that you fill more than one sanitary pad in one hour. Bleeding should not be heavier than your heaviest period. Summary  After the procedure, it is common to have pain at your incision site, abdominal cramping, and slight bleeding from your vagina.  Check  your incision area every day for signs of infection.  Tell your health care provider about any unusual symptoms.  Keep all follow-up visits for you and your baby as told by your health care provider. This information is not intended to replace advice given to you by your health care provider. Make sure you discuss any questions you have with your health care provider. Document Revised: 11/15/2017 Document Reviewed: 11/15/2017 Elsevier Patient Education  2020 [employer]  

## 2019-10-15 LAB — RH IG WORKUP (INCLUDES ABO/RH)
ABO/RH(D): O NEG
Fetal Screen: NEGATIVE
Gestational Age(Wks): 39
Unit division: 0

## 2019-10-24 ENCOUNTER — Other Ambulatory Visit: Payer: Self-pay

## 2019-10-24 ENCOUNTER — Ambulatory Visit (INDEPENDENT_AMBULATORY_CARE_PROVIDER_SITE_OTHER): Payer: Self-pay | Admitting: *Deleted

## 2019-10-24 VITALS — BP 139/79 | HR 93 | Wt 183.0 lb

## 2019-10-24 DIAGNOSIS — Z98891 History of uterine scar from previous surgery: Secondary | ICD-10-CM

## 2019-10-24 DIAGNOSIS — Z4889 Encounter for other specified surgical aftercare: Secondary | ICD-10-CM

## 2019-10-24 NOTE — Progress Notes (Signed)
Subjective:     Krista Combs is a 24 y.o. female who presents to the clinic 2 weeks status post cesarean section.    Objective:    BP 139/79   Pulse 93   Wt 183 lb (83 kg)   BMI 33.47 kg/m        Incision:   healing well, no drainage, no erythema, no hernia, no seroma, no swelling, no dehiscence, incision well approximated     Assessment:    Doing well postoperatively.   Plan:    1. Continue any current medications. 2. Wound care discussed. 3. Follow up: at postpartum visit.   Scheryl Marten, RN

## 2019-10-25 NOTE — Progress Notes (Signed)
Patient was assessed and managed by nursing staff during this encounter. I have reviewed the chart and agree with the documentation and plan. I have also made any necessary editorial changes.  [place] Bing, MD 10/25/2019 8:56 AM

## 2019-11-05 ENCOUNTER — Ambulatory Visit (INDEPENDENT_AMBULATORY_CARE_PROVIDER_SITE_OTHER): Payer: Self-pay | Admitting: *Deleted

## 2019-11-05 ENCOUNTER — Other Ambulatory Visit: Payer: Self-pay

## 2019-11-05 VITALS — BP 128/83 | HR 65 | Wt 186.0 lb

## 2019-11-05 DIAGNOSIS — Z98891 History of uterine scar from previous surgery: Secondary | ICD-10-CM

## 2019-11-05 MED ORDER — FLUCONAZOLE 150 MG PO TABS: 150.0000 mg | ORAL_TABLET | Freq: Once | ORAL | 0 refills | Status: AC

## 2019-11-05 MED ORDER — SULFAMETHOXAZOLE-TRIMETHOPRIM 800-160 MG PO TABS: 1.0000 | ORAL_TABLET | Freq: Two times a day (BID) | ORAL | 0 refills | Status: AC

## 2019-11-05 NOTE — Progress Notes (Signed)
Pt here today for an incision check, status post cesarean section on 5/20. Pt is concerned it may be infected.    Incision has areas of redness and swelling and is warm to the touch. Will have the provider check it.    Per Dr Macon Large will start pt on antibiotics.

## 2019-11-05 NOTE — Progress Notes (Signed)
Patient was assessed and managed by nursing staff during this encounter.  I also examined her incision and noted blanching erythema concerning for cellulitis, Bactrim DS prescribed.  I have reviewed the chart and agree with the documentation and plan.  Patient to follow up in one week.   [name], MD 11/05/2019 12:13 PM

## 2019-11-06 ENCOUNTER — Ambulatory Visit: Payer: Self-pay | Admitting: Family Medicine

## 2019-11-13 ENCOUNTER — Encounter: Payer: Self-pay | Admitting: Medical

## 2019-11-13 ENCOUNTER — Ambulatory Visit (INDEPENDENT_AMBULATORY_CARE_PROVIDER_SITE_OTHER): Payer: Self-pay | Admitting: Medical

## 2019-11-13 ENCOUNTER — Other Ambulatory Visit: Payer: Self-pay

## 2019-11-13 DIAGNOSIS — Z9851 Tubal ligation status: Secondary | ICD-10-CM

## 2019-11-13 DIAGNOSIS — Z98891 History of uterine scar from previous surgery: Secondary | ICD-10-CM

## 2019-11-13 NOTE — Progress Notes (Signed)
    Post Partum Visit Note  Krista Combs is a 24 y.o. [id number] female who presents for a postpartum visit. She is 5 weeks postpartum following a repeat cesarean section.  I have fully reviewed the prenatal and intrapartum course. The delivery was at  39.6 gestational weeks.  Anesthesia: spinal. Postpartum course has been uncomlicated. Baby is doing well no complications. Baby is feeding by breast. Bleeding thinks she have started her period today.. Bowel function is normal. Bladder function is normal. Patient is not sexually active. Contraception method is tubal ligation. Postpartum depression screening: negative.  The following portions of the patient's history were reviewed and updated as appropriate: allergies, current medications, past family history, past medical history, past social history, past surgical history and problem list.  Review of Systems Pertinent items are noted in HPI.    Objective:  Blood pressure 133/80, pulse 93, weight 192 lb (87.1 kg), currently breastfeeding.  General:  alert and cooperative   Breasts:  not performed  Lungs: clear to auscultation bilaterally  Heart:  regular rate and rhythm, S1, S2 normal, no murmur, click, rub or gallop  Abdomen: normal findings: soft, non-tender Incision is healing well. Small areas of erythema noted. No drainage.    Vulva:  not evaluated  Vagina: not evaluated  Cervix:  not evaluated  Corpus: not examined  Adnexa:  not evaluated  Rectal Exam: Not performed.        Assessment:    Normal postpartum exam.  S/P repeat LTCS S/P BTL  Plan:   Essential components of care per ACOG recommendations:  1.  Mood and well being: Patient with negative depression screening today. Reviewed local resources for support.  - Patient does not use tobacco.  - hx of drug use? Yes, MJ > 1 year ago  2. Infant care and feeding:  -Patient currently breastmilk feeding? Yes If breastmilk feeding discussed return to work and pumping. If  needed, patient was provided letter for work to allow for every 2-3 hr pumping breaks, and to be granted a private location to express breastmilk and refrigerated area to store breastmilk. Reviewed importance of draining breast regularly to support lactation. -Social determinants of health (SDOH) reviewed in EPIC. No concerns  3. Sexuality, contraception and birth spacing - Patient does not want a pregnancy in the next year.  Desired family size is 2 children.  - Reviewed forms of contraception in tiered fashion. Patient desired bilateral tubal ligation today.    4. Sleep and fatigue -Encouraged family/partner/community support of 4 hrs of uninterrupted sleep to help with mood and fatigue  5. Physical Recovery  - Discussed patients delivery and complications - Patient has urinary incontinence? No  - Patient is not safe to resume physical and sexual activity. Plan to return to normal activity starting at 8 weeks PP  6.  Health Maintenance - Last pap smear done 02/2019 and was normal with negative HPV.  7. Post operative infection - Still taking Bactrim, symptoms are significantly improved   Vonzella Nipple, PA-C [facility] [employer], [facility]

## 2019-11-13 NOTE — Patient Instructions (Signed)
Cesarean Delivery, Care After This sheet gives you information about how to care for yourself after your procedure. Your health care provider may also give you more specific instructions. If you have problems or questions, contact your health care provider. What can I expect after the procedure? After the procedure, it is common to have:  A small amount of blood or clear fluid coming from the incision.  Some redness, swelling, and pain in your incision area.  Some abdominal pain and soreness.  Vaginal bleeding (lochia). Even though you did not have a vaginal delivery, you will still have vaginal bleeding and discharge.  Pelvic cramps.  Fatigue. You may have pain, swelling, and discomfort in the tissue between your vagina and your anus (perineum) if:  Your C-section was unplanned, and you were allowed to labor and push.  An incision was made in the area (episiotomy) or the tissue tore during attempted vaginal delivery. Follow these instructions at home: Incision care   Follow instructions from your health care provider about how to take care of your incision. Make sure you: ? Wash your hands with soap and water before you change your bandage (dressing). If soap and water are not available, use hand sanitizer. ? If you have a dressing, change it or remove it as told by your health care provider. ? Leave stitches (sutures), skin staples, skin glue, or adhesive strips in place. These skin closures may need to stay in place for 2 weeks or longer. If adhesive strip edges start to loosen and curl up, you may trim the loose edges. Do not remove adhesive strips completely unless your health care provider tells you to do that.  Check your incision area every day for signs of infection. Check for: ? More redness, swelling, or pain. ? More fluid or blood. ? Warmth. ? Pus or a bad smell.  Do not take baths, swim, or use a hot tub until your health care provider says it's okay. Ask your health  care provider if you can take showers.  When you cough or sneeze, hug a pillow. This helps with pain and decreases the chance of your incision opening up (dehiscing). Do this until your incision heals. Medicines  Take over-the-counter and prescription medicines only as told by your health care provider.  If you were prescribed an antibiotic medicine, take it as told by your health care provider. Do not stop taking the antibiotic even if you start to feel better.  Do not drive or use heavy machinery while taking prescription pain medicine. Lifestyle  Do not drink alcohol. This is especially important if you are breastfeeding or taking pain medicine.  Do not use any products that contain nicotine or tobacco, such as cigarettes, e-cigarettes, and chewing tobacco. If you need help quitting, ask your health care provider. Eating and drinking  Drink at least 8 eight-ounce glasses of water every day unless told not to by your health care provider. If you breastfeed, you may need to drink even more water.  Eat high-fiber foods every day. These foods may help prevent or relieve constipation. High-fiber foods include: ? Whole grain cereals and breads. ? Brown rice. ? Beans. ? Fresh fruits and vegetables. Activity   If possible, have someone help you care for your baby and help with household activities for at least a few days after you leave the hospital.  Return to your normal activities as told by your health care provider. Ask your health care provider what activities are safe for   you.  Rest as much as possible. Try to rest or take a nap while your baby is sleeping.  Do not lift anything that is heavier than 10 lbs (4.5 kg), or the limit that you were told, until your health care provider says that it is safe.  Talk with your health care provider about when you can engage in sexual activity. This may depend on your: ? Risk of infection. ? How fast you heal. ? Comfort and desire to  engage in sexual activity. General instructions  Do not use tampons or douches until your health care provider approves.  Wear loose, comfortable clothing and a supportive and well-fitting bra.  Keep your perineum clean and dry. Wipe from front to back when you use the toilet.  If you pass a blood clot, save it and call your health care provider to discuss. Do not flush blood clots down the toilet before you get instructions from your health care provider.  Keep all follow-up visits for you and your baby as told by your health care provider. This is important. Contact a health care provider if:  You have: ? A fever. ? Bad-smelling vaginal discharge. ? Pus or a bad smell coming from your incision. ? Difficulty or pain when urinating. ? A sudden increase or decrease in the frequency of your bowel movements. ? More redness, swelling, or pain around your incision. ? More fluid or blood coming from your incision. ? A rash. ? Nausea. ? Little or no interest in activities you used to enjoy. ? Questions about caring for yourself or your baby.  Your incision feels warm to the touch.  Your breasts turn red or become painful or hard.  You feel unusually sad or worried.  You vomit.  You pass a blood clot from your vagina.  You urinate more than usual.  You are dizzy or light-headed. Get help right away if:  You have: ? Pain that does not go away or get better with medicine. ? Chest pain. ? Difficulty breathing. ? Blurred vision or spots in your vision. ? Thoughts about hurting yourself or your baby. ? New pain in your abdomen or in one of your legs. ? A severe headache.  You faint.  You bleed from your vagina so much that you fill more than one sanitary pad in one hour. Bleeding should not be heavier than your heaviest period. Summary  After the procedure, it is common to have pain at your incision site, abdominal cramping, and slight bleeding from your vagina.  Check  your incision area every day for signs of infection.  Tell your health care provider about any unusual symptoms.  Keep all follow-up visits for you and your baby as told by your health care provider. This information is not intended to replace advice given to you by your health care provider. Make sure you discuss any questions you have with your health care provider. Document Revised: 11/15/2017 Document Reviewed: 11/15/2017 Elsevier Patient Education  2020 [employer] Inc.  

## 2019-11-15 ENCOUNTER — Inpatient Hospital Stay (HOSPITAL_COMMUNITY): Payer: Self-pay

## 2019-11-15 ENCOUNTER — Other Ambulatory Visit: Payer: Self-pay

## 2019-11-15 ENCOUNTER — Encounter (HOSPITAL_COMMUNITY): Payer: Self-pay | Admitting: Obstetrics and Gynecology

## 2019-11-15 ENCOUNTER — Inpatient Hospital Stay (HOSPITAL_COMMUNITY)
Admission: AD | Admit: 2019-11-15 | Discharge: 2019-11-15 | Disposition: A | Discharge status: Home / Self Care | Payer: Medicaid Other | Attending: Obstetrics and Gynecology | Admitting: Obstetrics and Gynecology

## 2019-11-15 DIAGNOSIS — R42 Dizziness and giddiness: Secondary | ICD-10-CM | POA: Insufficient documentation

## 2019-11-15 DIAGNOSIS — O99893 Other specified diseases and conditions complicating puerperium: Secondary | ICD-10-CM

## 2019-11-15 DIAGNOSIS — E079 Disorder of thyroid, unspecified: Secondary | ICD-10-CM | POA: Insufficient documentation

## 2019-11-15 DIAGNOSIS — R109 Unspecified abdominal pain: Secondary | ICD-10-CM | POA: Insufficient documentation

## 2019-11-15 DIAGNOSIS — N939 Abnormal uterine and vaginal bleeding, unspecified: Secondary | ICD-10-CM | POA: Insufficient documentation

## 2019-11-15 DIAGNOSIS — D649 Anemia, unspecified: Secondary | ICD-10-CM | POA: Insufficient documentation

## 2019-11-15 DIAGNOSIS — Z87891 Personal history of nicotine dependence: Secondary | ICD-10-CM | POA: Insufficient documentation

## 2019-11-15 DIAGNOSIS — Z79899 Other long term (current) drug therapy: Secondary | ICD-10-CM | POA: Insufficient documentation

## 2019-11-15 LAB — CBC WITH DIFFERENTIAL/PLATELET
Abs Immature Granulocytes: 0.03 10*3/uL (ref 0.00–0.07)
Basophils Absolute: 0 10*3/uL (ref 0.0–0.1)
Basophils Relative: 1 %
Eosinophils Absolute: 0.2 10*3/uL (ref 0.0–0.5)
Eosinophils Relative: 2 %
HCT: 37 % (ref 36.0–46.0)
Hemoglobin: 11.4 g/dL — ABNORMAL LOW (ref 12.0–15.0)
Immature Granulocytes: 0 %
Lymphocytes Relative: 26 %
Lymphs Abs: 2.2 10*3/uL (ref 0.7–4.0)
MCH: 25.3 pg — ABNORMAL LOW (ref 26.0–34.0)
MCHC: 30.8 g/dL (ref 30.0–36.0)
MCV: 82 fL (ref 80.0–100.0)
Monocytes Absolute: 0.9 10*3/uL (ref 0.1–1.0)
Monocytes Relative: 11 %
Neutro Abs: 4.9 10*3/uL (ref 1.7–7.7)
Neutrophils Relative %: 60 %
Platelets: 219 10*3/uL (ref 150–400)
RBC: 4.51 MIL/uL (ref 3.87–5.11)
RDW: 17.4 % — ABNORMAL HIGH (ref 11.5–15.5)
WBC: 8.3 10*3/uL (ref 4.0–10.5)
nRBC: 0 % (ref 0.0–0.2)

## 2019-11-15 LAB — TYPE AND SCREEN
ABO/RH(D): O NEG
Antibody Screen: POSITIVE

## 2019-11-15 LAB — HCG, QUANTITATIVE, PREGNANCY: hCG, Beta Chain, Quant, S: 1 m[IU]/mL (ref <5)

## 2019-11-15 MED ORDER — IBUPROFEN 800 MG PO TABS: 800.0000 mg | ORAL_TABLET | Freq: Four times a day (QID) | ORAL | 0 refills | Status: AC

## 2019-11-15 NOTE — Discharge Instructions (Signed)
You were evaluated today for heavy vaginal bleeding that is likely due to onset of your menses following postpartum period.   Please take the ibuprofen prescribed to help with cramping and bleeding. You can take this medication every 6-8 hours as needed.   Please return to care if you experience worsening bleeding (saturating more than one pad per hour) or experience worsening dizziness, shortness of breath, increased heart rate or extreme fatigue.

## 2019-11-15 NOTE — MAU Provider Note (Addendum)
History     CSN: [id number]  Arrival date and time: 11/15/19 1403    Chief Complaint  Patient presents with   Vaginal Bleeding   Dizziness   Patient reports that her lochia has been brownish and decreasing in amount for the last few weeks and stopped completely within the last few days prior to onset of heavy bright red bleeding that began 3 days ago. Patient reports saturating a pad every hour. She also reports having episodes of dizziness and abdominal cramping. She reports breast feeding exclusively and was not expecting resumption of menses this soon after cessation of lochia.  Denies SOB, palpitations, hematochezia. Patient reports history of anemia. Has taken tylenol to help with cramping.    OB History     Gravida  7   Para  2   Term  2   Preterm  0   AB  5   Living  2      SAB  5   TAB  0   Ectopic  0   Multiple  0   Live Births  2        Obstetric Comments  G1: 10wk by LMP. Pt never went to MD or u/s. Early 2017 G2: 01/2016 missed AB.          Past Medical History:  Diagnosis Date   Anemia    Anxiety    Depression    no meds, doing ok   Epilepsy (HCC)    Seizure disorder (HCC)    last seizure 5 years ago per pt, not on meds   Seizures (HCC)    epilepsy - can't remember last seizures, years ago   Thyroid disease    Hyperthyroidism     Past Surgical History:  Procedure Laterality Date   CESAREAN SECTION N/A 02/05/2017   Procedure: CESAREAN SECTION;  Surgeon: Krista Rosenthal, MD;  Location: [facility] BIRTHING SUIT[facility];  Service: Obstetrics;  Laterality: N/A;   CESAREAN SECTION WITH BILATERAL TUBAL LIGATION N/A 10/10/2019   Procedure: CESAREAN SECTION WITH BILATERAL TUBAL LIGATION;  Surgeon: Krista Newcomer, MD;  Location: [facility] LD ORS;  Service: Obstetrics;  Laterality: N/A;   WISDOM TOOTH EXTRACTION      Family History  Problem Relation Age of Onset   Diabetes Paternal Grandfather    Migraines Neg Hx     Social History   Tobacco  Use   Smoking status: Former Smoker    Packs/day: 0.20    Types: Cigarettes   Smokeless tobacco: Never Used  Vaping Use   Vaping Use: Never used  Substance Use Topics   Alcohol use: No   Drug use: Not Currently    Types: Marijuana    Comment: 9/12    Allergies:  Allergies  Allergen Reactions   Pistachio Nut (Diagnostic) Anaphylaxis   Minocycline Hives   Cinnamon Nausea And Vomiting    Medications Prior to Admission  Medication Sig Dispense Refill Last Dose   acetaminophen (TYLENOL) 325 MG tablet Take 325 mg by mouth every 6 (six) hours as needed for mild pain or headache.    unk   prenatal vitamin w/FE, FA (PRENATAL 1 + 1) 27-1 MG TABS tablet Take 1 tablet by mouth daily at 12 noon.   Past Week at Unknown time   sulfamethoxazole-trimethoprim (BACTRIM DS) 800-160 MG tablet Take 1 tablet by mouth 2 (two) times daily for 10 days. 20 tablet 0 Past Week at Unknown time   ibuprofen (ADVIL) 800 MG tablet Take 1 tablet (800 mg  total) by mouth every 6 (six) hours. (Patient not taking: Reported on 11/13/2019) 30 tablet 0 Not Taking at Unknown time   iron polysaccharides (NIFEREX) 150 MG capsule Take 1 capsule (150 mg total) by mouth daily. (Patient not taking: Reported on 10/11/2019) 30 capsule 0 Not Taking at Unknown time   oxyCODONE (OXY IR/ROXICODONE) 5 MG immediate release tablet Take 1-2 tablets (5-10 mg total) by mouth every 4 (four) hours as needed for moderate pain. (Patient not taking: Reported on 11/13/2019) 20 tablet 0 Not Taking at Unknown time   polyethylene glycol powder (GLYCOLAX/MIRALAX) 17 GM/SCOOP powder Take 17 g by mouth daily as needed. (Patient not taking: Reported on 11/13/2019) 510 g 1 Not Taking at Unknown time    Review of Systems  Constitutional: Negative for chills and fever.  Gastrointestinal: Negative for blood in stool and vomiting.  Genitourinary: Positive for vaginal bleeding. Negative for pelvic pain and vaginal pain.       Cramping   Neurological: Positive  for dizziness. Negative for syncope.   Physical Exam   Blood pressure 133/86, pulse 72, temperature 98.6 F (37 C), temperature source Oral, resp. rate 20, SpO2 98 %, currently breastfeeding.  Physical Exam Constitutional:      General: She is not in acute distress.    Appearance: Normal appearance.  Pulmonary:     Effort: Pulmonary effort is normal. No respiratory distress.  Abdominal:     Palpations: Abdomen is soft.  Genitourinary:    General: Normal vulva.     Comments: Blood pooling in vaginal vault  No cervical erythema, occasional low volume blood oozing from cervical os, no signs of cervical or vaginal trauma on speculum exam  Skin:    General: Skin is warm and dry.     Coloration: Skin is not pale.  Neurological:     Mental Status: She is alert.  Psychiatric:        Mood and Affect: Mood normal.        Behavior: Behavior normal.        Thought Content: Thought content normal.    MAU Course  Procedures  MDM Patient with stable hemoglobin 11.4 (previously 7.8-9.9) Low concern for symptomatic anemia .  BhCG of 1 in setting of TVUS without signs of products of conception. Patient reporting cessation of lochia prior to onset of heavy vaginal bleeding. Patient to discharge home with diagnosis of menses onset after postpartum period.   Assessment and Plan    Postpartum Vaginal Bleeding  Patient had normally progressing lochia, bleeding is likely excessive PP menses. Low suspicion for retained products of conception as patient is s/p c-section. Patient is afebrile and denies chills with stable vital signs today. Likely experiencing excessive bleeding of menses causing anemia and dizziness. Patient received iron transfusion during postpartum inpatient stay  - CBC, hbg 11.4  - hcg, 1 -prescribed 800mg  ibuprofen every 6-8 hours  - Transvaginal U/S: did not visualize products of conceptions, endometrial lining 33mm, some free fluid in pelvis; likely 2/2 to bleeding  - return  precautions discussed including: prolonged dizziness with standing    Krista Combs 11/15/2019, 5:26 PM   I was present for and assisted with the exam and agree with above.  Krista Combs, [place], [place] 11/15/2019 6:01 PM

## 2019-11-15 NOTE — MAU Note (Signed)
c/s del 5/20.  Bleeding completely stopped about 5 days ago, had been brownish for a couple wks.  Started having heavy bleeding 2 days ago.  Was told really shouldn't be because she is solely breast feeding. Was told to come in because she has been dizzy.

## 2020-03-27 IMAGING — US US MFM OB FOLLOW-UP
1 series · 13 of 28 positions shown · non-contrast
Comparison: none

[Series 1: us mfm ob follow-up · 13 of 50 slices shown]
[im 2/50]
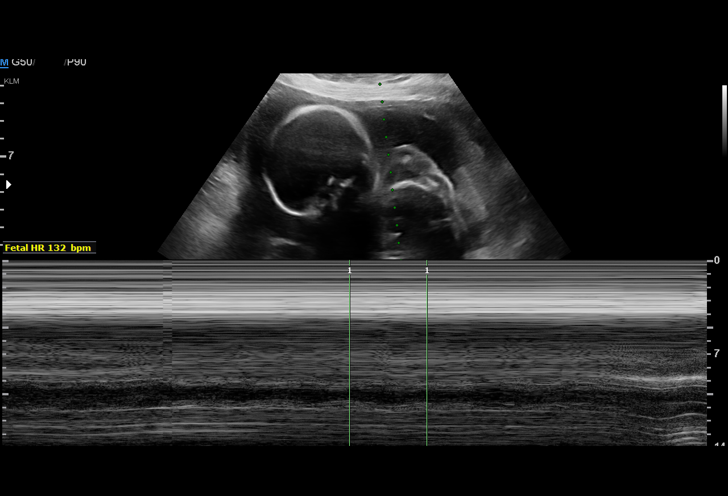
[im 6/50]
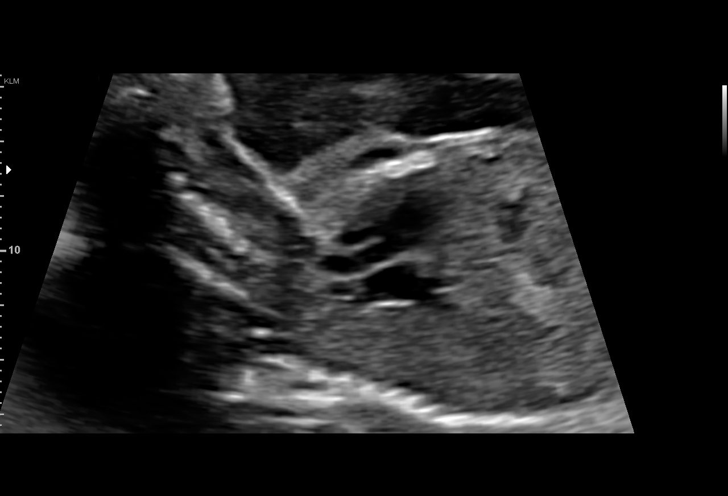
[im 10/50]
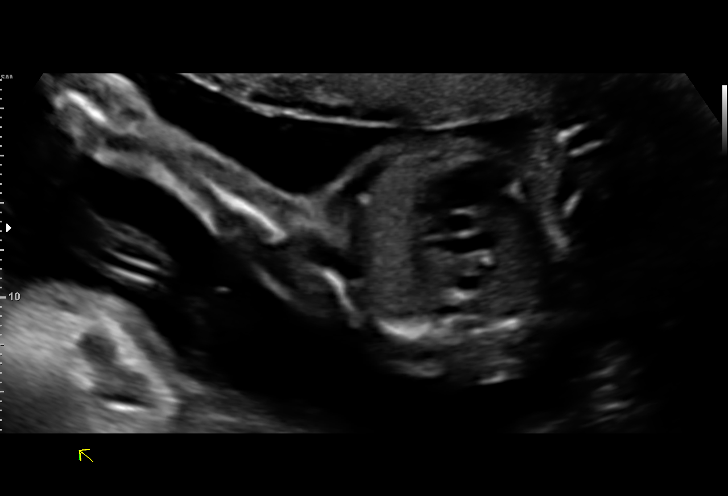
[im 13/50]
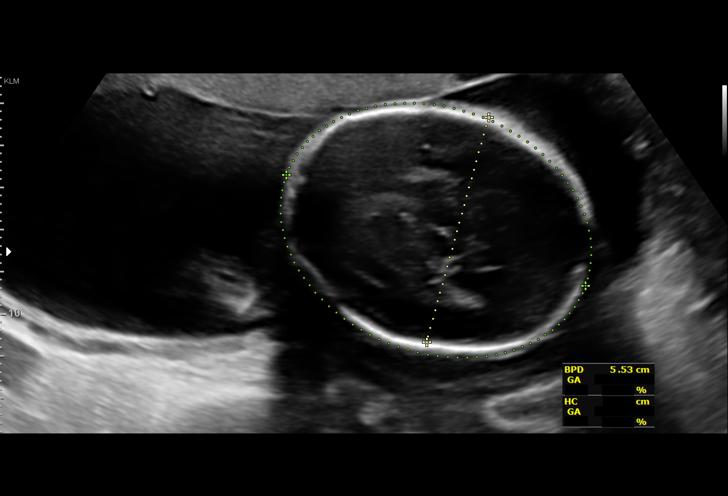
[im 17/50]
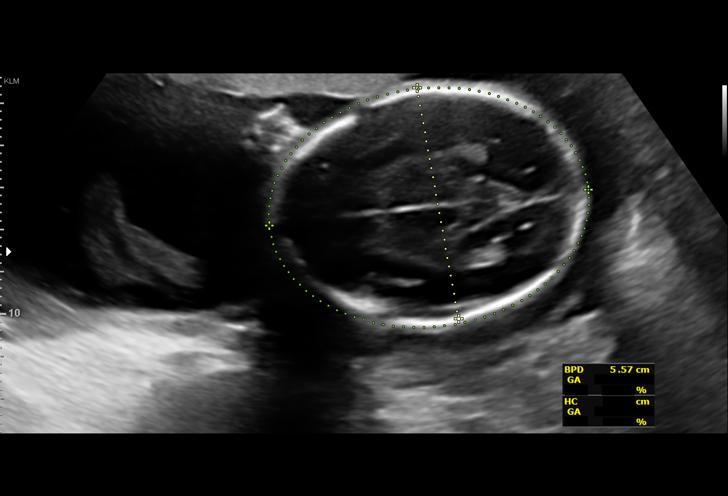
[im 20/50]
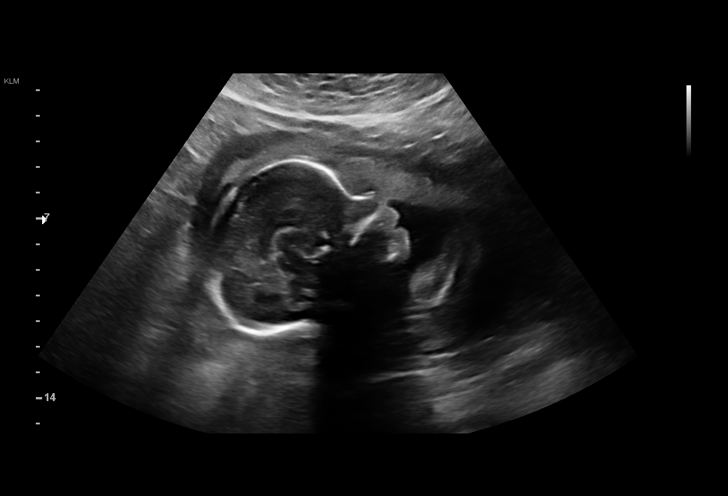
[im 26/50]
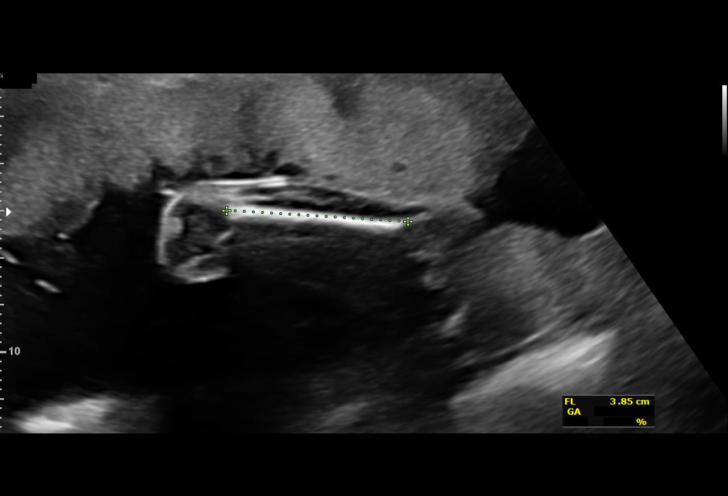
[im 30/50]
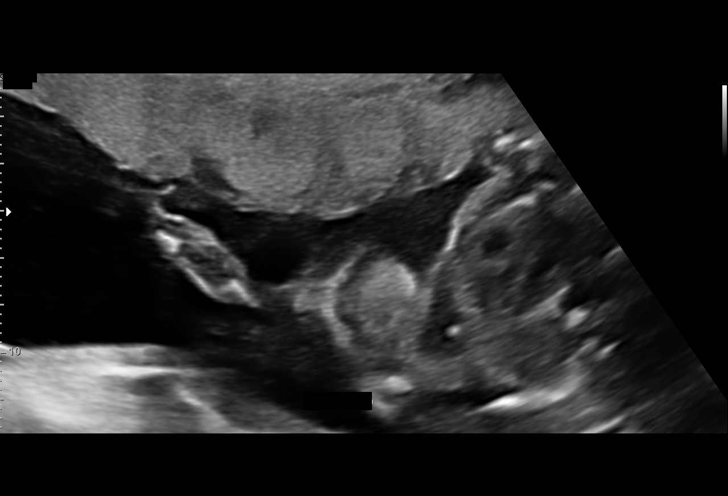
[im 33/50]
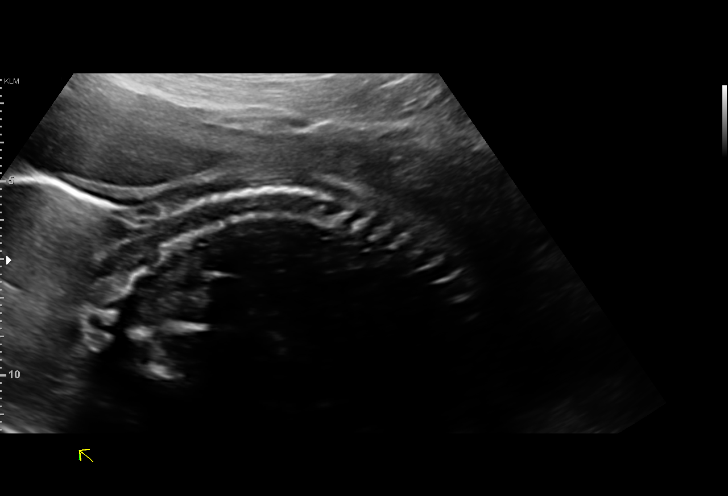
[im 37/50]
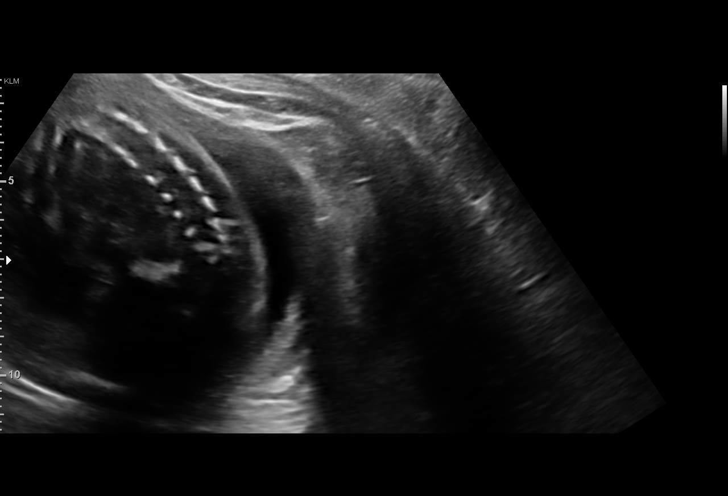
[im 40/50]
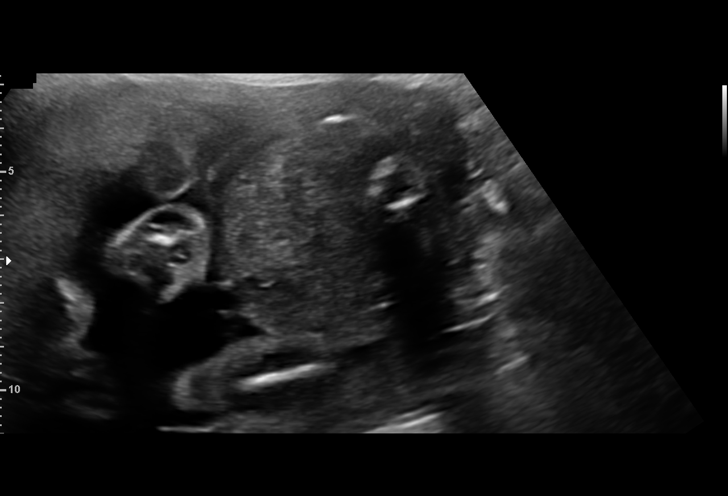
[im 44/50]
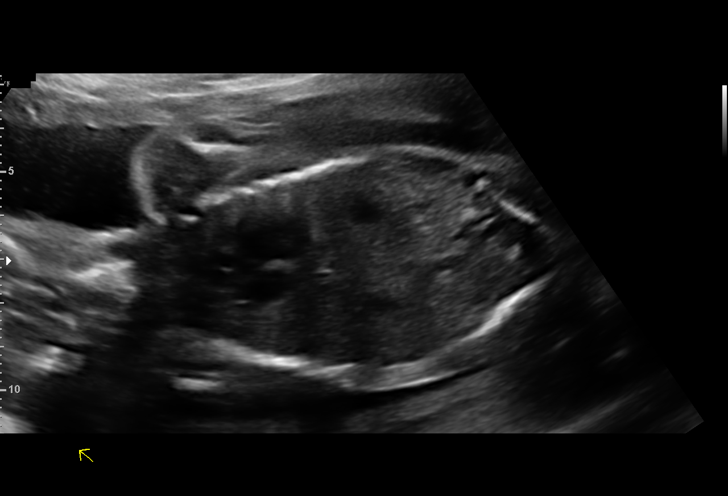
[im 48/50]
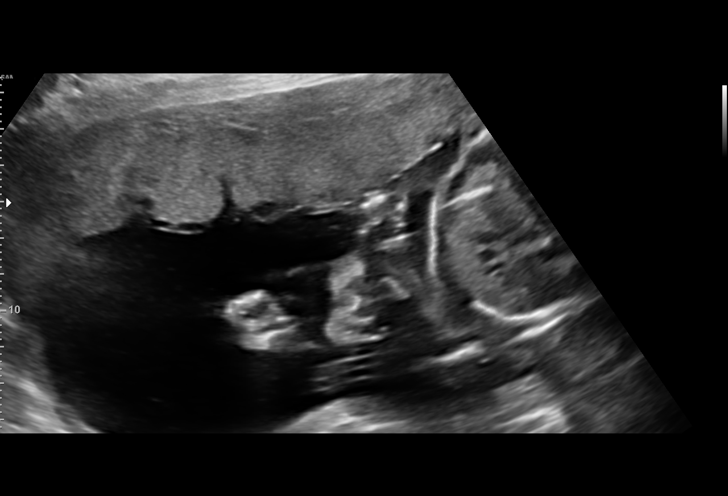

[13 of 28 positions shown; findings below may reference images not displayed]

NOLTE
 ----------------------------------------------------------------------

 ----------------------------------------------------------------------
Indications

  Antenatal follow-up for nonvisualized fetal
  anatomy
  22 weeks gestation of pregnancy
  Encounter for antenatal screening for
  malformations (Low Risk NIPS)
  Rh negative state in antepartum
  Previous cesarean delivery, antepartum
  Other mental disorder complicating
  pregnancy, second trimester
  Obesity complicating pregnancy, second
  trimester (BMI 32)
 ----------------------------------------------------------------------
Vital Signs

                                                Height:        5'2"
Fetal Evaluation

 Num Of Fetuses:          1
 Fetal Heart Rate(bpm):   132
 Cardiac Activity:        Observed
 Presentation:            Variable
 Placenta:                Anterior
 P. Cord Insertion:       Previously Visualized

 Amniotic Fluid
 AFI FV:      Within normal limits

                             Largest Pocket(cm)

Biometry
 BPD:      55.4  mm     G. Age:  22w 6d         53  %    CI:        70.36   %    70 - 86
                                                         FL/HC:       18.1  %    19.2 -
 HC:      210.6  mm     G. Age:  23w 1d         54  %    HC/AC:       1.16       1.05 -
 AC:      181.2  mm     G. Age:  23w 0d         50  %    FL/BPD:      68.8  %    71 - 87
 FL:       38.1  mm     G. Age:  22w 1d         23  %    FL/AC:       21.0  %    20 - 24

 Est. FW:     525   gm     1 lb 3 oz     41  %
OB History

 Blood Type:    O-
 Gravidity:    7         Term:   1        Prem:   0        SAB:   5
 TOP:          0       Ectopic:  0        Living: 1
Gestational Age

 LMP:           28w 1d        Date:  11/27/18                 EDD:   09/03/19
 U/S Today:     22w 6d                                        EDD:   10/10/19
 Best:          22w 5d     Det. By:  Early Ultrasound         EDD:   10/11/19
                                     (03/13/19)
Anatomy

 Cranium:               Appears normal         Aortic Arch:            Appears normal
 Cavum:                 Appears normal         Ductal Arch:            Appears normal
 Ventricles:            Appears normal         Diaphragm:              Appears normal
 Choroid Plexus:        Appears normal         Stomach:                Appears normal, left
                                                                       sided
 Cerebellum:            Appears normal         Abdomen:                Appears normal
 Posterior Fossa:       Appears normal         Abdominal Wall:         Previously seen
 Nuchal Fold:           Previously seen        Cord Vessels:           Previously seen
 Face:                  Orbits and profile     Kidneys:                Appear normal
                        previously seen
 Lips:                  Appears normal         Bladder:                Appears normal
 Thoracic:              Appears normal         Spine:                  Appears normal
 Heart:                 Appears normal         Upper Extremities:      Previously seen
                        (4CH, axis, and
                        situs)
 RVOT:                  Appears normal         Lower Extremities:      Previously seen
 LVOT:                  Appears normal

 Other:  Female gender Nasal bone previously visualized. Heels previously
         visualized. Technically difficult due to maternal habitus and fetal
         position.
Cervix Uterus Adnexa

 Cervix
 Length:            3.4  cm.
 Normal appearance by transabdominal scan.
Impression

 Normal interval growth
 Good fetal movement and amniotic fluid
Recommendations

 Follow up as clinically indicated

## 2020-04-29 ENCOUNTER — Encounter: Payer: Self-pay | Admitting: General Practice

## 2020-08-30 IMAGING — US US PELVIS COMPLETE WITH TRANSVAGINAL
1 series · 15 of 25 positions shown · non-contrast
Comparison: None

CLINICAL DATA: Vaginal bleeding, C-section 5 weeks ago

EXAM:
TRANSABDOMINAL AND TRANSVAGINAL ULTRASOUND OF PELVIS
TECHNIQUE: Both transabdominal and transvaginal ultrasound examinations of the
pelvis were performed. Transabdominal technique was performed for
global imaging of the pelvis including uterus, ovaries, adnexal
regions, and pelvic cul-de-sac. It was necessary to proceed with
endovaginal exam following the transabdominal exam to visualize the
uterus endometrium ovaries.

[Series 1: us pelvis complete with transvaginal · 15 of 77 slices shown]
[im 1/77]
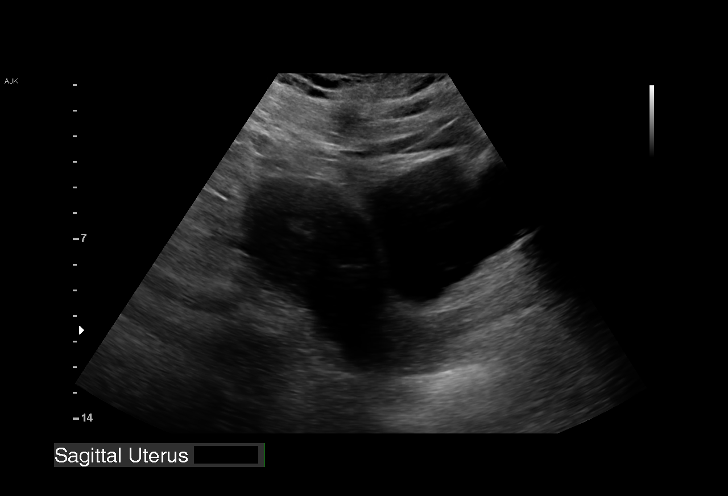
[im 7/77]
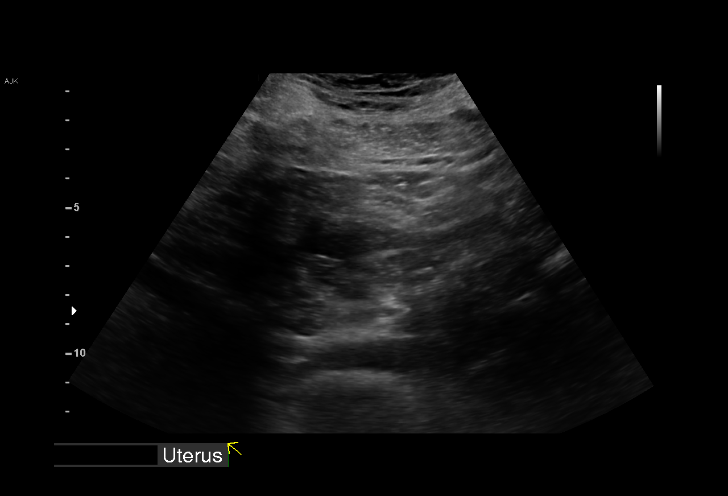
[im 13/77]
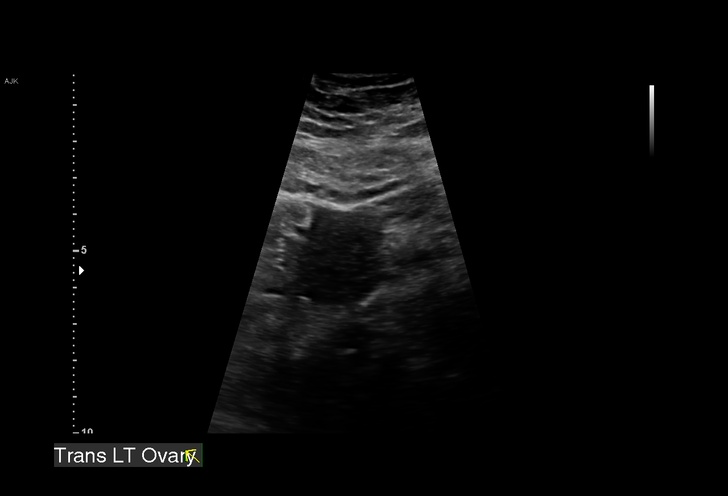
[im 16/77]
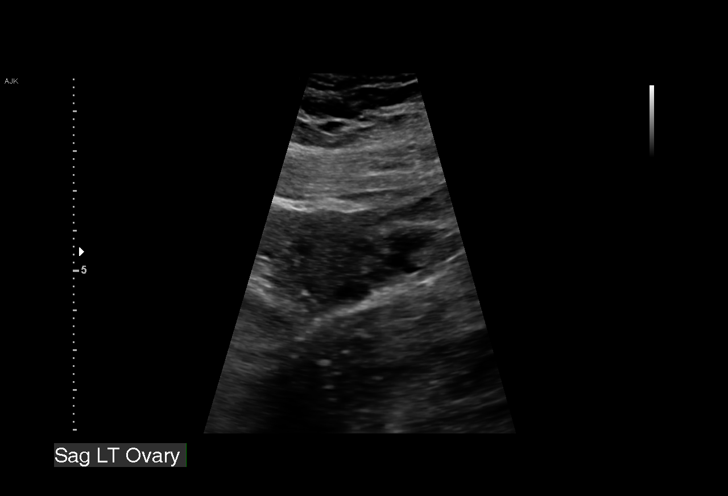
[im 23/77]
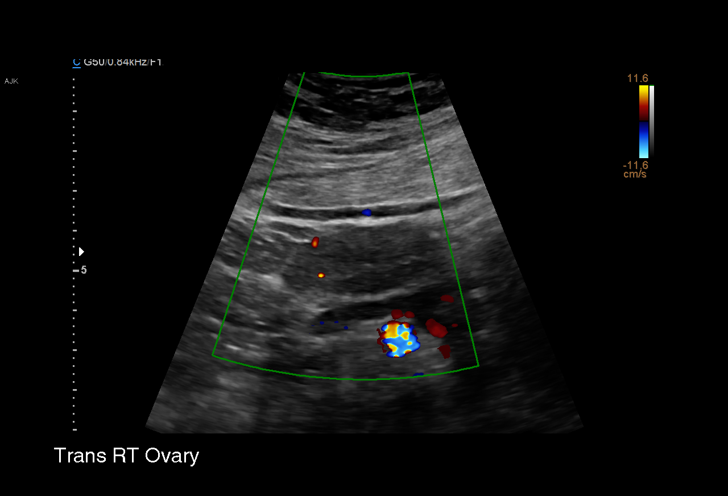
[im 29/77]
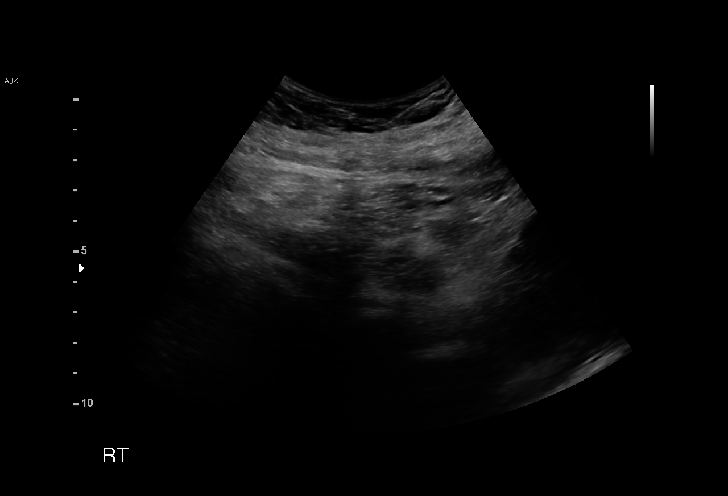
[im 32/77]
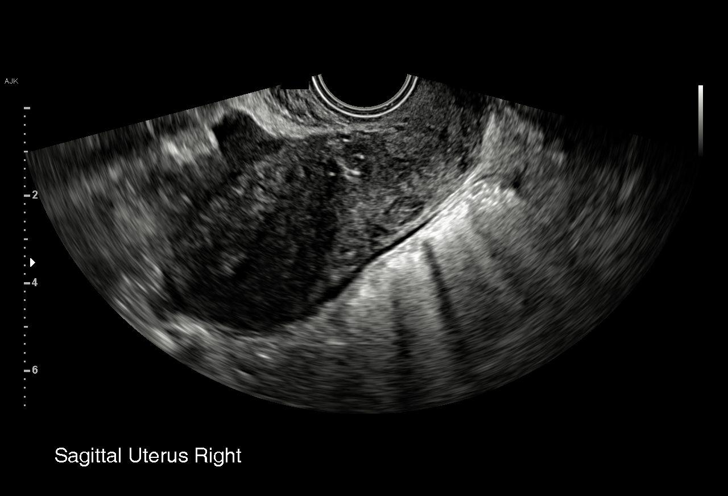
[im 39/77]
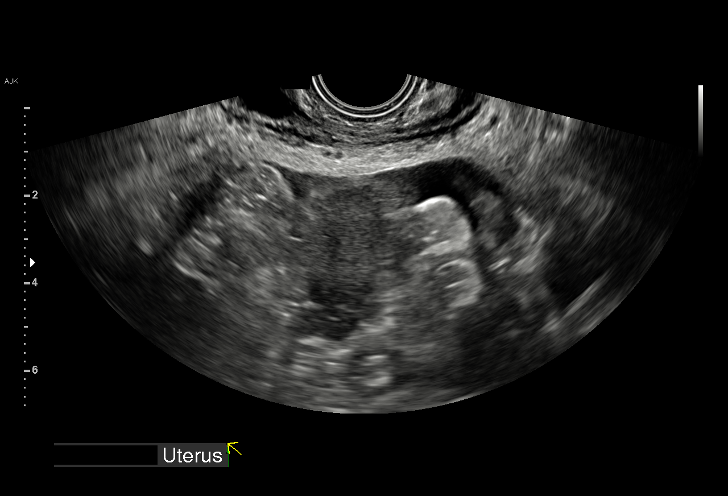
[im 45/77]
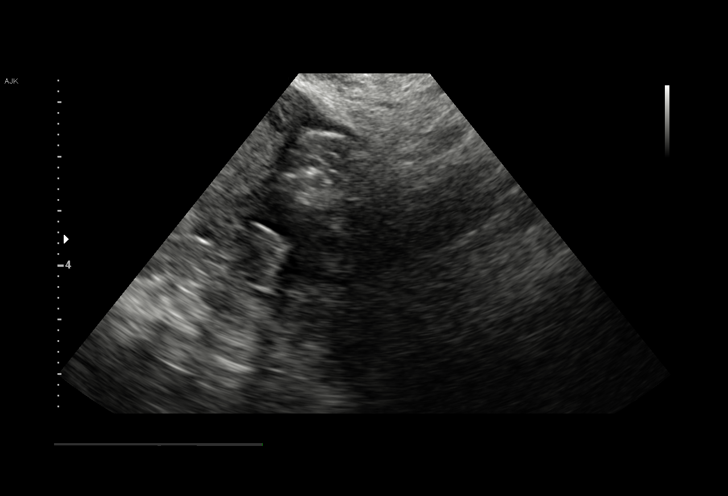
[im 48/77]
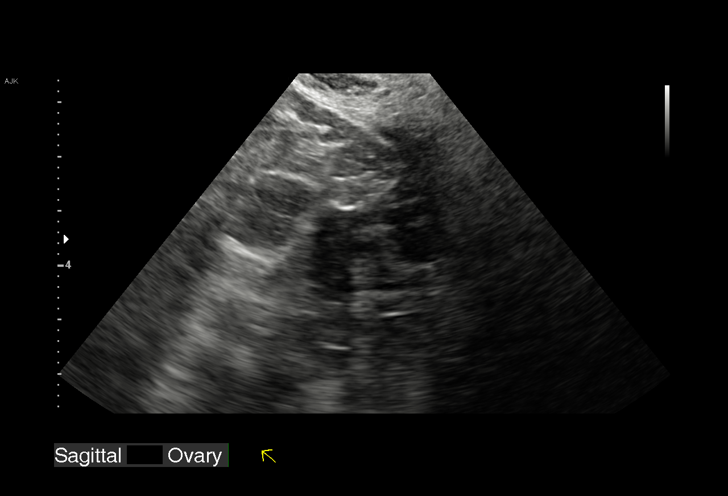
[im 54/77]
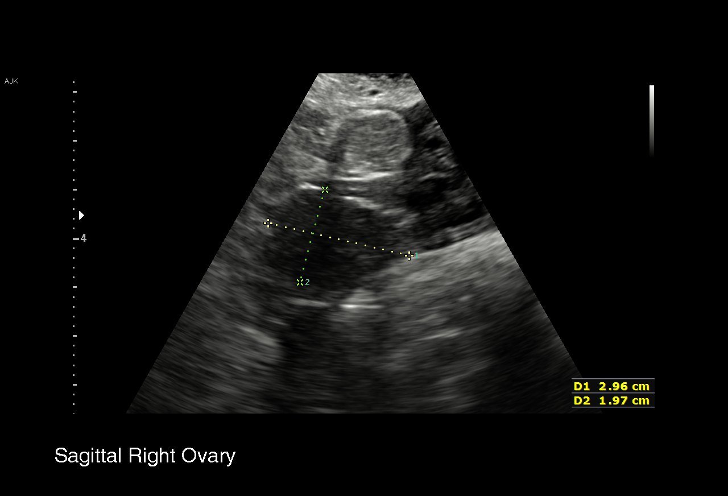
[im 61/77]
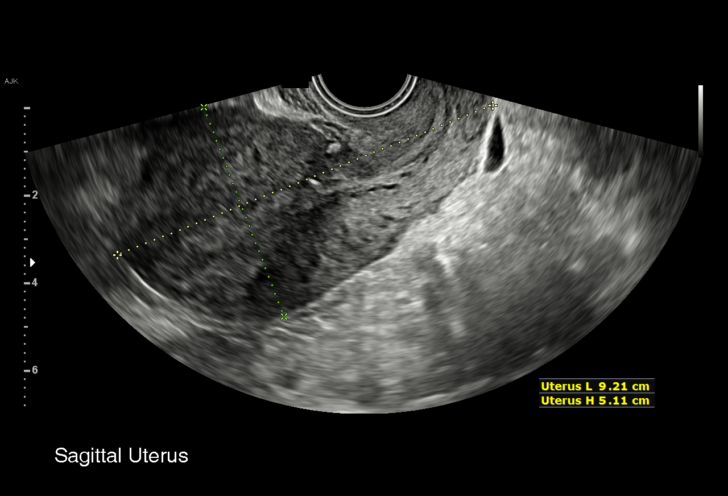
[im 64/77]
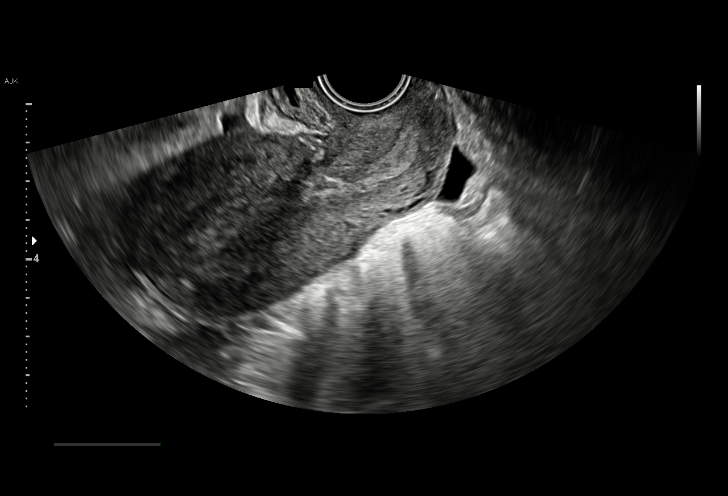
[im 70/77]
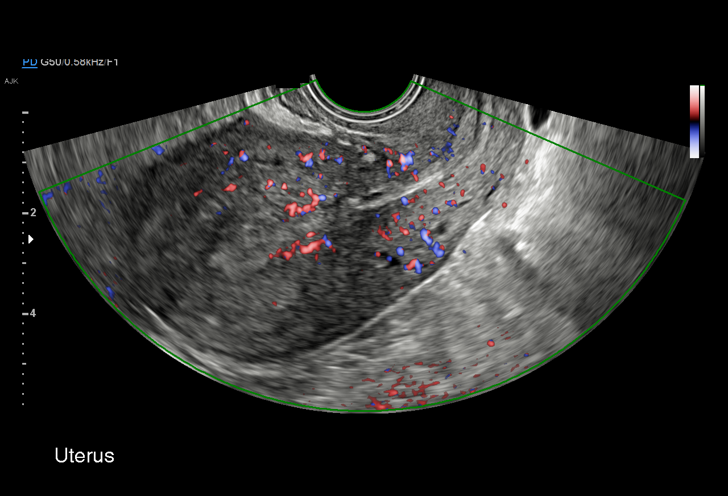
[im 77/77]
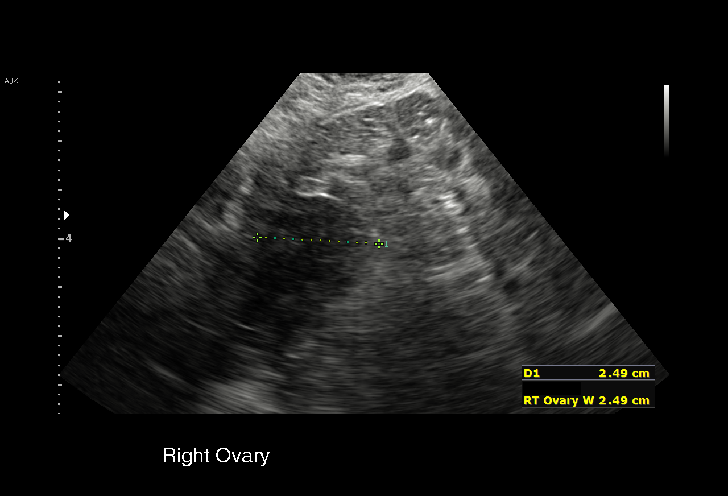

[15 of 25 positions shown; findings below may reference images not displayed]

FINDINGS: Uterus

Measurements: 9.2 x 5.1 x 8.4 cm = volume: 206.3 mL. Cyst area in
section scar at the lower anterior uterine segment.

Endometrium

Thickness: 7 mm.  No focal abnormality visualized.

Right ovary

Measurements: 3 x 2 x 2.5 cm = volume: 7.8 mL. Normal appearance/no
adnexal mass.

Left ovary

Measurements: 2.6 x 1.6 x 1.9 cm = volume: 4.1 mL. Normal
appearance/no adnexal mass.

Other findings

Trace free fluid.
IMPRESSION: 1. Endometrial thickness of 7 mm, no evidence for retained products
of conception.
2. Trace free fluid in the pelvis.  Otherwise negative examination.

## 2023-05-11 ENCOUNTER — Encounter (HOSPITAL_COMMUNITY): Payer: Self-pay | Admitting: Emergency Medicine

## 2023-05-11 ENCOUNTER — Other Ambulatory Visit: Payer: Self-pay

## 2023-05-11 ENCOUNTER — Emergency Department (HOSPITAL_COMMUNITY): Payer: Self-pay

## 2023-05-11 ENCOUNTER — Emergency Department (HOSPITAL_COMMUNITY)
Admission: EM | Admit: 2023-05-11 | Discharge: 2023-05-12 | Discharge status: Against Medical Advice | Payer: Self-pay | Attending: Emergency Medicine | Admitting: Emergency Medicine

## 2023-05-11 DIAGNOSIS — J029 Acute pharyngitis, unspecified: Secondary | ICD-10-CM | POA: Insufficient documentation

## 2023-05-11 DIAGNOSIS — Z5321 Procedure and treatment not carried out due to patient leaving prior to being seen by health care provider: Secondary | ICD-10-CM | POA: Insufficient documentation

## 2023-05-11 DIAGNOSIS — R059 Cough, unspecified: Secondary | ICD-10-CM | POA: Insufficient documentation

## 2023-05-11 DIAGNOSIS — Z1152 Encounter for screening for COVID-19: Secondary | ICD-10-CM | POA: Insufficient documentation

## 2023-05-11 LAB — GROUP A STREP BY PCR: Group A Strep by PCR: NOT DETECTED

## 2023-05-11 LAB — RESP PANEL BY RT-PCR (RSV, FLU A&B, COVID)  RVPGX2
Influenza A by PCR: NEGATIVE
Influenza B by PCR: NEGATIVE
Resp Syncytial Virus by PCR: NEGATIVE
SARS Coronavirus 2 by RT PCR: NEGATIVE

## 2023-05-11 NOTE — ED Notes (Signed)
 Patient was called x3 and no answer. Patient was moved OTF.

## 2023-05-11 NOTE — ED Provider Triage Note (Signed)
Emergency Medicine Provider Triage Evaluation Note  Krista Combs , a 27 y.o. female  was evaluated in triage.  Pt complains of cough sore throat x 4 days worsening worse at night sometimes wakes up gasping for air no exertional dyspnea daughter with recent symptoms that are the same..  Review of Systems  Positive: cough Negative: fever  Physical Exam  There were no vitals taken for this visit. Gen:   Awake, no distress   Resp:  Normal effort  MSK:   Moves extremities without difficulty  Other:    Medical Decision Making  Medically screening exam initiated at 3:17 PM.  Appropriate orders placed.  Krista Combs was informed that the remainder of the evaluation will be completed by another provider, this initial triage assessment does not replace that evaluation, and the importance of remaining in the ED until their evaluation is complete.     Arthor Captain, PA-C 05/11/23 [phone]

## 2023-05-11 NOTE — ED Triage Notes (Signed)
Cough and sore throat x 4 days, worse at night.  No fever chills, n/v/d or CP.  Pt reports worsening cough and shob at night.
